# Patient Record
Sex: Female | Born: 1995 | Race: Black or African American | Hispanic: No | Marital: Single | Smoking: Never smoker
Health system: Southern US, Community
[De-identification: ages and names within clinical notes are randomized; demographics above are authoritative.]

## PROBLEM LIST (undated history)

## (undated) DIAGNOSIS — I10 Essential (primary) hypertension: Secondary | ICD-10-CM

## (undated) HISTORY — DX: Essential (primary) hypertension: I10

---
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
## 2016-01-06 ENCOUNTER — Encounter (HOSPITAL_COMMUNITY): Payer: Self-pay | Admitting: *Deleted

## 2016-01-06 ENCOUNTER — Emergency Department (HOSPITAL_COMMUNITY): Payer: Medicaid Other

## 2016-01-06 ENCOUNTER — Emergency Department (HOSPITAL_COMMUNITY)
Admission: EM | Admit: 2016-01-06 | Discharge: 2016-01-07 | Disposition: A | Discharge status: Home / Self Care | Payer: Medicaid Other | Attending: Emergency Medicine | Admitting: Emergency Medicine

## 2016-01-06 DIAGNOSIS — R000 Tachycardia, unspecified: Secondary | ICD-10-CM | POA: Insufficient documentation

## 2016-01-06 DIAGNOSIS — J159 Unspecified bacterial pneumonia: Secondary | ICD-10-CM | POA: Insufficient documentation

## 2016-01-06 DIAGNOSIS — R1084 Generalized abdominal pain: Secondary | ICD-10-CM | POA: Insufficient documentation

## 2016-01-06 DIAGNOSIS — J189 Pneumonia, unspecified organism: Secondary | ICD-10-CM

## 2016-01-06 DIAGNOSIS — Z3202 Encounter for pregnancy test, result negative: Secondary | ICD-10-CM | POA: Insufficient documentation

## 2016-01-06 LAB — COMPREHENSIVE METABOLIC PANEL
ALBUMIN: 4 g/dL (ref 3.5–5.0)
ALT: 15 U/L (ref 14–54)
ANION GAP: 10 (ref 5–15)
AST: 18 U/L (ref 15–41)
Alkaline Phosphatase: 61 U/L (ref 38–126)
BILIRUBIN TOTAL: 0.9 mg/dL (ref 0.3–1.2)
BUN: 10 mg/dL (ref 6–20)
CO2: 24 mmol/L (ref 22–32)
Calcium: 9.1 mg/dL (ref 8.9–10.3)
Chloride: 104 mmol/L (ref 101–111)
Creatinine, Ser: 0.88 mg/dL (ref 0.44–1.00)
GFR calc Af Amer: >60 mL/min (ref >60)
GFR calc non Af Amer: >60 mL/min (ref >60)
GLUCOSE: 94 mg/dL (ref 65–99)
POTASSIUM: 3 mmol/L — AB (ref 3.5–5.1)
SODIUM: 138 mmol/L (ref 135–145)
TOTAL PROTEIN: 7.7 g/dL (ref 6.5–8.1)

## 2016-01-06 LAB — CBC WITH DIFFERENTIAL/PLATELET
BASOS ABS: 0 10*3/uL (ref 0.0–0.1)
BASOS PCT: 0 %
EOS PCT: 0 %
Eosinophils Absolute: 0 10*3/uL (ref 0.0–0.7)
HCT: 29.8 % — ABNORMAL LOW (ref 36.0–46.0)
Hemoglobin: 9.2 g/dL — ABNORMAL LOW (ref 12.0–15.0)
Lymphocytes Relative: 14 %
Lymphs Abs: 1.3 10*3/uL (ref 0.7–4.0)
MCH: 19.4 pg — ABNORMAL LOW (ref 26.0–34.0)
MCHC: 30.9 g/dL (ref 30.0–36.0)
MCV: 62.9 fL — ABNORMAL LOW (ref 78.0–100.0)
MONO ABS: 1 10*3/uL (ref 0.1–1.0)
Monocytes Relative: 10 %
Neutro Abs: 7.1 10*3/uL (ref 1.7–7.7)
Neutrophils Relative %: 76 %
Platelets: 295 10*3/uL (ref 150–400)
RBC: 4.74 MIL/uL (ref 3.87–5.11)
RDW: 16.4 % — AB (ref 11.5–15.5)
WBC: 9.4 10*3/uL (ref 4.0–10.5)

## 2016-01-06 LAB — URINALYSIS, ROUTINE W REFLEX MICROSCOPIC
Bilirubin Urine: NEGATIVE
Glucose, UA: NEGATIVE mg/dL
KETONES UR: NEGATIVE mg/dL
NITRITE: NEGATIVE
PH: 7 (ref 5.0–8.0)
PROTEIN: NEGATIVE mg/dL
Specific Gravity, Urine: 1.005 (ref 1.005–1.030)

## 2016-01-06 LAB — RAPID STREP SCREEN (MED CTR MEBANE ONLY): STREPTOCOCCUS, GROUP A SCREEN (DIRECT): NEGATIVE

## 2016-01-06 LAB — PREGNANCY, URINE: PREG TEST UR: NEGATIVE

## 2016-01-06 LAB — URINE MICROSCOPIC-ADD ON: Squamous Epithelial / LPF: NONE SEEN

## 2016-01-06 LAB — LIPASE, BLOOD: Lipase: 20 U/L (ref 11–51)

## 2016-01-06 LAB — I-STAT CG4 LACTIC ACID, ED: LACTIC ACID, VENOUS: 1.94 mmol/L (ref 0.5–2.0)

## 2016-01-06 MED ORDER — DIPHENHYDRAMINE HCL 50 MG/ML IJ SOLN
25.0000 mg | Freq: Once | INTRAMUSCULAR | Status: AC
  Administered 2016-01-06: 25 mg via INTRAVENOUS
  Filled 2016-01-06: qty 1

## 2016-01-06 MED ORDER — SODIUM CHLORIDE 0.9 % IV BOLUS (SEPSIS)
1000.0000 mL | Freq: Once | INTRAVENOUS | Status: AC
  Administered 2016-01-06: 1000 mL via INTRAVENOUS

## 2016-01-06 MED ORDER — METOCLOPRAMIDE HCL 5 MG/ML IJ SOLN
10.0000 mg | Freq: Once | INTRAMUSCULAR | Status: AC
  Administered 2016-01-06: 10 mg via INTRAVENOUS
  Filled 2016-01-06: qty 2

## 2016-01-06 MED ORDER — POTASSIUM CHLORIDE CRYS ER 20 MEQ PO TBCR
40.0000 meq | EXTENDED_RELEASE_TABLET | Freq: Once | ORAL | Status: AC
  Administered 2016-01-06: 40 meq via ORAL
  Filled 2016-01-06: qty 2

## 2016-01-06 MED ORDER — SODIUM CHLORIDE 0.9 % IV BOLUS (SEPSIS)
1000.0000 mL | INTRAVENOUS | Status: AC
Start: 2016-01-06 — End: 2016-01-06
  Administered 2016-01-06: 1000 mL via INTRAVENOUS

## 2016-01-06 MED ORDER — KETOROLAC TROMETHAMINE 30 MG/ML IJ SOLN
30.0000 mg | Freq: Once | INTRAMUSCULAR | Status: AC
  Administered 2016-01-06: 30 mg via INTRAVENOUS
  Filled 2016-01-06: qty 1

## 2016-01-06 NOTE — ED Notes (Signed)
Pt has been told a urine sample is needed from her, states that she "can't try to use the bathroom right now". Will try again after she gets more fluids

## 2016-01-06 NOTE — ED Provider Notes (Signed)
CSN: [id number]     Arrival date & time 01/06/16  2129 History   First MD Initiated Contact with Patient 01/06/16 2143     Chief Complaint  Patient presents with  . Headache     (Consider location/radiation/quality/duration/timing/severity/associated sxs/prior Treatment) HPI Comments: Patient presents to the emergency department with chief complaint of headache. She states that she has had an ache for the past week, has gradually been worsening. She reports associated generalized body aches, fevers, chills. She also reports having a cough, and some generalized abdominal pain, but denies any nausea, vomiting, or diarrhea. There are no modifying factors. She denies any numbness, weakness, or tingling. Denies any dysuria or vaginal discharge. Denies any chest pain or shortness of breath. She has tried taking Tylenol for her symptoms.  The history is provided by the patient. No language interpreter was used.    History reviewed. No pertinent past medical history. History reviewed. No pertinent past surgical history. No family history on file. Social History  Substance Use Topics  . Smoking status: Never Smoker   . Smokeless tobacco: None  . Alcohol Use: Yes   OB History    No data available     Review of Systems  Constitutional: Positive for fever and chills.  HENT: Positive for postnasal drip, rhinorrhea, sinus pressure, sneezing and sore throat.   Respiratory: Positive for cough. Negative for shortness of breath.   Cardiovascular: Negative for chest pain.  Gastrointestinal: Negative for nausea, vomiting, abdominal pain, diarrhea and constipation.  Genitourinary: Negative for dysuria.  Neurological: Positive for headaches.  All other systems reviewed and are negative.     Allergies  Review of patient's allergies indicates no known allergies.  Home Medications   Prior to Admission medications   Not on File   BP 171/96 mmHg  Pulse 132  Temp(Src) 102.1 F (38.9 C) (Oral)   Resp 16  Ht 5\' 6"  (1.676 m)  SpO2 98%  LMP 01/03/2016 Physical Exam  Constitutional: She is oriented to person, place, and time. She appears well-developed and well-nourished.  HENT:  Head: Normocephalic and atraumatic.  Right Ear: External ear normal.  Left Ear: External ear normal.  Oropharynx mildly erythematous, no tonsillar exudates, no abscess, uvula is midline, airway intact  Eyes: Conjunctivae and EOM are normal. Pupils are equal, round, and reactive to light.  Neck: Normal range of motion. Neck supple.  No pain with neck flexion, no meningismus  Cardiovascular: Regular rhythm and normal heart sounds.  Exam reveals no gallop and no friction rub.   No murmur heard. Tachycardic  Pulmonary/Chest: Effort normal and breath sounds normal. No respiratory distress. She has no wheezes. She has no rales. She exhibits no tenderness.  Abdominal: Soft. She exhibits no distension and no mass. There is no tenderness. There is no rebound and no guarding.  No focal abdominal tenderness, no RLQ tenderness or pain at McBurney's point, no RUQ tenderness or Murphy's sign, no left-sided abdominal tenderness, no fluid wave, or signs of peritonitis   Musculoskeletal: Normal range of motion. She exhibits no edema or tenderness.  Normal gait.  Neurological: She is alert and oriented to person, place, and time. She has normal reflexes.  CN 3-12 intact, normal finger to nose, no pronator drift, sensation and strength intact bilaterally. Negative Kernig Negative Brudzinski  Skin: Skin is warm and dry.  Psychiatric: She has a normal mood and affect. Her behavior is normal. Judgment and thought content normal.  Nursing note and vitals reviewed.   ED Course  Procedures (including critical care time) Results for orders placed or performed during the hospital encounter of 01/06/16  Rapid strep screen  Result Value Ref Range   Streptococcus, Group A Screen (Direct) NEGATIVE NEGATIVE  CBC with  Differential/Platelet  Result Value Ref Range   WBC 9.4 4.0 - 10.5 K/uL   RBC 4.74 3.87 - 5.11 MIL/uL   Hemoglobin 9.2 (L) 12.0 - 15.0 g/dL   HCT 60.4 (L) 54.0 - 98.1 %   MCV 62.9 (L) 78.0 - 100.0 fL   MCH 19.4 (L) 26.0 - 34.0 pg   MCHC 30.9 30.0 - 36.0 g/dL   RDW 19.1 (H) 47.8 - 29.5 %   Platelets 295 150 - 400 K/uL   Neutrophils Relative % 76 %   Neutro Abs 7.1 1.7 - 7.7 K/uL   Lymphocytes Relative 14 %   Lymphs Abs 1.3 0.7 - 4.0 K/uL   Monocytes Relative 10 %   Monocytes Absolute 1.0 0.1 - 1.0 K/uL   Eosinophils Relative 0 %   Eosinophils Absolute 0.0 0.0 - 0.7 K/uL   Basophils Relative 0 %   Basophils Absolute 0.0 0.0 - 0.1 K/uL   Smear Review MORPHOLOGY UNREMARKABLE   Comprehensive metabolic panel  Result Value Ref Range   Sodium 138 135 - 145 mmol/L   Potassium 3.0 (L) 3.5 - 5.1 mmol/L   Chloride 104 101 - 111 mmol/L   CO2 24 22 - 32 mmol/L   Glucose, Bld 94 65 - 99 mg/dL   BUN 10 6 - 20 mg/dL   Creatinine, Ser 6.21 0.44 - 1.00 mg/dL   Calcium 9.1 8.9 - 30.8 mg/dL   Total Protein 7.7 6.5 - 8.1 g/dL   Albumin 4.0 3.5 - 5.0 g/dL   AST 18 15 - 41 U/L   ALT 15 14 - 54 U/L   Alkaline Phosphatase 61 38 - 126 U/L   Total Bilirubin 0.9 0.3 - 1.2 mg/dL   GFR calc non Af Amer >60 >60 mL/min   GFR calc Af Amer >60 >60 mL/min   Anion gap 10 5 - 15  Lipase, blood  Result Value Ref Range   Lipase 20 11 - 51 U/L  Urinalysis, Routine w reflex microscopic (not at [facility])  Result Value Ref Range   Color, Urine YELLOW YELLOW   APPearance CLEAR CLEAR   Specific Gravity, Urine 1.005 1.005 - 1.030   pH 7.0 5.0 - 8.0   Glucose, UA NEGATIVE NEGATIVE mg/dL   Hgb urine dipstick LARGE (A) NEGATIVE   Bilirubin Urine NEGATIVE NEGATIVE   Ketones, ur NEGATIVE NEGATIVE mg/dL   Protein, ur NEGATIVE NEGATIVE mg/dL   Nitrite NEGATIVE NEGATIVE   Leukocytes, UA TRACE (A) NEGATIVE  Pregnancy, urine  Result Value Ref Range   Preg Test, Ur NEGATIVE NEGATIVE  Urine microscopic-add on  Result  Value Ref Range   Squamous Epithelial / LPF NONE SEEN NONE SEEN   WBC, UA 0-5 0 - 5 WBC/hpf   RBC / HPF TOO NUMEROUS TO COUNT 0 - 5 RBC/hpf   Bacteria, UA FEW (A) NONE SEEN  I-Stat CG4 Lactic Acid, ED  Result Value Ref Range   Lactic Acid, Venous 1.94 0.5 - 2.0 mmol/L   Dg Chest 2 View  01/06/2016  CLINICAL DATA:  20 year old female with fever and chills EXAM: CHEST  2 VIEW COMPARISON:  None. FINDINGS: Two views of the chest demonstrate minimal atelectatic changes of the left lung base. Developing pneumonia is not excluded. Clinical correlation is recommended. There  is no focal consolidation, pleural effusion, or pneumothorax. The cardiac silhouette is within normal limits. No acute osseous pathology. IMPRESSION: Left lung base atelectasis.  No focal consolidation. Electronically Signed   By: Elgie Collard M.D.   On: 01/06/2016 22:07    I have personally reviewed and evaluated these images and lab results as part of my medical decision-making.    MDM   Final diagnoses:  CAP (community acquired pneumonia)    Patient with fever, chills, generalized body aches, and headache. She has had a headache for one week. She has no difficulty moving her neck in any direction. She is smiling and laughing with friends. She does not appear toxic. I have very low suspicion for meningitis or encephalitis. Will give fluids, give Tylenol, and will reassess. Patient is neurovascularly intact.  Chest x-ray remarkable for possible developing pneumonia.  Patient seen by and discussed with Dr. Wilkie Aye, who agrees that patient is very well appearing.  Recommends DC to home with azithromycin.  PCP follow-up.  Return precautions discussed.  Pertinent prior notes, workups, and admissions reviewed by me in the EMR. Patient understands and agrees with the plan.  Verbal and written follow-up and return precautions given. Patient is stable and ready for discharge.        Roxy Horseman, PA-C 01/07/16  3086  Bethann Berkshire, MD 01/08/16 1027

## 2016-01-06 NOTE — ED Notes (Addendum)
Pt c/o headache "constant" for 1 week. Also c/o midline abdominal pain today, no n/v/d. Last tylenol at noon

## 2016-01-07 MED ORDER — AZITHROMYCIN 250 MG PO TABS: 250.0000 mg | ORAL_TABLET | Freq: Every day | ORAL | Status: DC

## 2016-01-07 NOTE — Discharge Instructions (Signed)

## 2016-01-08 LAB — URINE CULTURE

## 2016-01-09 LAB — CULTURE, GROUP A STREP (THRC)

## 2016-01-13 DIAGNOSIS — R87612 Low grade squamous intraepithelial lesion on cytologic smear of cervix (LGSIL): Secondary | ICD-10-CM | POA: Insufficient documentation

## 2016-04-30 ENCOUNTER — Emergency Department (HOSPITAL_COMMUNITY)
Admission: EM | Admit: 2016-04-30 | Discharge: 2016-04-30 | Disposition: A | Discharge status: Home / Self Care | Payer: Medicaid Other | Attending: Emergency Medicine | Admitting: Emergency Medicine

## 2016-04-30 ENCOUNTER — Encounter (HOSPITAL_COMMUNITY): Payer: Self-pay

## 2016-04-30 DIAGNOSIS — N938 Other specified abnormal uterine and vaginal bleeding: Secondary | ICD-10-CM | POA: Insufficient documentation

## 2016-04-30 DIAGNOSIS — Z79899 Other long term (current) drug therapy: Secondary | ICD-10-CM | POA: Insufficient documentation

## 2016-04-30 LAB — URINALYSIS, ROUTINE W REFLEX MICROSCOPIC
BILIRUBIN URINE: NEGATIVE
Glucose, UA: NEGATIVE mg/dL
KETONES UR: NEGATIVE mg/dL
NITRITE: NEGATIVE
Protein, ur: NEGATIVE mg/dL
SPECIFIC GRAVITY, URINE: 1.014 (ref 1.005–1.030)
pH: 8 (ref 5.0–8.0)

## 2016-04-30 LAB — WET PREP, GENITAL
Sperm: NONE SEEN
Trich, Wet Prep: NONE SEEN
Yeast Wet Prep HPF POC: NONE SEEN

## 2016-04-30 LAB — URINE MICROSCOPIC-ADD ON

## 2016-04-30 LAB — POC URINE PREG, ED: Preg Test, Ur: NEGATIVE

## 2016-04-30 NOTE — Discharge Instructions (Signed)
Please follow up with GYN for further evaluation. Return if heavy bleeding, fever, severe abdominal pain, any new concerning symptom. Your cultures still pending, you will be called if return abnormal.

## 2016-04-30 NOTE — ED Notes (Signed)
Patient ambulatory to restroom for urine sample.

## 2016-04-30 NOTE — ED Notes (Signed)
Pelvic setup at bedside.

## 2016-04-30 NOTE — ED Provider Notes (Signed)
[facility]-EMERGENCY DEPT Provider Note   CSN: [id number] Arrival date & time: 04/30/16  1843     History   Chief Complaint Chief Complaint  Patient presents with  . Vaginal Bleeding    HPI Lindsay Hess is a 20 y.o. female.  HPI Lindsay Hess is a 20 y.o. female with no medical problems, presents to emergency department complaining of irregular vaginal bleeding. Patient states that for the last 2 months she has had some bleeding that would start just a day or 2 after her regular period would and. She denies any associated abdominal pain or cramping. States bleeding is moderately heavy. She denies any urinary symptoms. She denies any nausea or vomiting. She has not tried any medications for this. She is sexually active with one partner, does not use protection. Unsure if she could be pregnant.  History reviewed. No pertinent past medical history.  There are no active problems to display for this patient.   No past surgical history on file.  OB History    No data available       Home Medications    Prior to Admission medications   Medication Sig Start Date End Date Taking? Authorizing Provider  azithromycin (ZITHROMAX) 250 MG tablet Take 1 tablet (250 mg total) by mouth daily. Take first 2 tablets together, then 1 every day until finished. 01/07/16   Roxy Horseman, PA-C    Family History No family history on file.  Social History Social History  Substance Use Topics  . Smoking status: Never Smoker  . Smokeless tobacco: Not on file  . Alcohol use Yes     Allergies   Review of patient's allergies indicates no known allergies.   Review of Systems Review of Systems  Constitutional: Negative for chills and fever.  Respiratory: Negative for cough, chest tightness and shortness of breath.   Cardiovascular: Negative for chest pain, palpitations and leg swelling.  Gastrointestinal: Negative for abdominal pain, diarrhea, nausea and vomiting.  Genitourinary: Positive  for vaginal bleeding. Negative for dysuria, flank pain, pelvic pain, vaginal discharge and vaginal pain.  Musculoskeletal: Negative for arthralgias, myalgias, neck pain and neck stiffness.  Skin: Negative for rash.  Neurological: Negative for dizziness, weakness and headaches.  All other systems reviewed and are negative.    Physical Exam Updated Vital Signs BP (!) 168/101 (BP Location: Left Arm)   Pulse 86   Temp 99.1 F (37.3 C) (Oral)   Resp 16   LMP 04/17/2016 (Approximate)   SpO2 100%   Physical Exam  Constitutional: She appears well-developed and well-nourished. No distress.  HENT:  Head: Normocephalic.  Eyes: Conjunctivae are normal.  Neck: Neck supple.  Cardiovascular: Normal rate, regular rhythm and normal heart sounds.   Pulmonary/Chest: Effort normal and breath sounds normal. No respiratory distress. She has no wheezes. She has no rales.  Abdominal: Soft. Bowel sounds are normal. She exhibits no distension. There is no tenderness. There is no rebound.  Genitourinary:  Genitourinary Comments: Normal external genitalia. Normal vaginal canal. Small thin pink discharge. Cervix is normal, closed. No CMT. No uterine or adnexal tenderness. No masses palpated.    Musculoskeletal: She exhibits no edema.  Neurological: She is alert.  Skin: Skin is warm and dry.  Psychiatric: She has a normal mood and affect. Her behavior is normal.  Nursing note and vitals reviewed.    ED Treatments / Results  Labs (all labs ordered are listed, but only abnormal results are displayed) Labs Reviewed  WET PREP, GENITAL - Abnormal; Notable  for the following:       Result Value   Clue Cells Wet Prep HPF POC PRESENT (*)    WBC, Wet Prep HPF POC MODERATE (*)    All other components within normal limits  URINALYSIS, ROUTINE W REFLEX MICROSCOPIC (NOT AT [facility]) - Abnormal; Notable for the following:    APPearance CLOUDY (*)    Hgb urine dipstick LARGE (*)    Leukocytes, UA SMALL (*)    All  other components within normal limits  URINE MICROSCOPIC-ADD ON - Abnormal; Notable for the following:    Squamous Epithelial / LPF 0-5 (*)    Bacteria, UA FEW (*)    All other components within normal limits  POC URINE PREG, ED  GC/CHLAMYDIA PROBE AMP ([place]) NOT AT [facility]    EKG  EKG Interpretation None       Radiology No results found.  Procedures Procedures (including critical care time)  Medications Ordered in ED Medications - No data to display   Initial Impression / Assessment and Plan / ED Course  I have reviewed the triage vital signs and the nursing notes.  Pertinent labs & imaging results that were available during my care of the patient were reviewed by me and considered in my medical decision making (see chart for details).  Clinical Course    Patient emergency department with irregular vaginal bleeding. No pain. Not on any birth control at this time. Does not appear to be in any distress. No dizziness or lightheadedness. Will check a pregnancy test, urinalysis, pelvic exam.  Pelvic exam unremarkable. Just small pink discharge. Will dc home with ob/gyn follow up . Gc/chlamydia cultuers pending.    Final Clinical Impressions(s) / ED Diagnoses   Final diagnoses:  Dysfunctional uterine bleeding    New Prescriptions Discharge Medication List as of 04/30/2016  9:18 PM       Jaynie Crumbleatyana [facility], PA-C 05/01/16 2022    Pricilla LovelessScott Goldston, MD 05/10/16 [phone]

## 2016-04-30 NOTE — ED Notes (Signed)
Patient d/c'd self care.  F/U reviewed.  Patient verbalized understanding. 

## 2016-04-30 NOTE — ED Triage Notes (Signed)
She states that for two months in a row she has had bleeding "after my normal period ended".  She denies pain/fever/n/v/d/dysuria and is in no distress.  She describes the amount of bleeding as "moderate" with no clots.

## 2016-05-03 LAB — GC/CHLAMYDIA PROBE AMP (~~LOC~~) NOT AT ARMC
CHLAMYDIA, DNA PROBE: NEGATIVE
Neisseria Gonorrhea: NEGATIVE

## 2016-05-08 ENCOUNTER — Emergency Department (HOSPITAL_COMMUNITY)
Admission: EM | Admit: 2016-05-08 | Discharge: 2016-05-08 | Disposition: A | Discharge status: Home / Self Care | Payer: Medicaid Other | Attending: Emergency Medicine | Admitting: Emergency Medicine

## 2016-05-08 ENCOUNTER — Encounter (HOSPITAL_COMMUNITY): Payer: Self-pay | Admitting: Oncology

## 2016-05-08 DIAGNOSIS — B9689 Other specified bacterial agents as the cause of diseases classified elsewhere: Secondary | ICD-10-CM

## 2016-05-08 DIAGNOSIS — N760 Acute vaginitis: Secondary | ICD-10-CM | POA: Diagnosis not present

## 2016-05-08 DIAGNOSIS — A64 Unspecified sexually transmitted disease: Secondary | ICD-10-CM | POA: Diagnosis present

## 2016-05-08 LAB — WET PREP, GENITAL
Sperm: NONE SEEN
TRICH WET PREP: NONE SEEN
YEAST WET PREP: NONE SEEN

## 2016-05-08 MED ORDER — METRONIDAZOLE 0.75 % VA GEL: 1.0000 | Freq: Two times a day (BID) | VAGINAL | 0 refills | Status: DC

## 2016-05-08 NOTE — ED Provider Notes (Signed)
[facility]-EMERGENCY DEPT Provider Note   CSN: [id number] Arrival date & time: 05/08/16  0404     History   Chief Complaint Chief Complaint  Patient presents with  . SEXUALLY TRANSMITTED DISEASE    HPI Lindsay Hess is a 20 y.o. female.  20 year old female presents to the emergency department for evaluation of a malodorous discharge times one month. She states that she came at 4:30 AM today because she "has been busy". Patient was seen one week ago for vaginal complaints. She is concerned about STDs because she engaged in unprotected sex with one partner 4 weeks ago. She reports last being sexually active 2 weeks ago. Patient denies associated abdominal pain, nausea, vomiting, fever, or urinary symptoms. She states that the vaginal bleeding from her prior visit has resolved.     History reviewed. No pertinent past medical history.  There are no active problems to display for this patient.   History reviewed. No pertinent surgical history.  OB History    No data available       Home Medications    Prior to Admission medications   Medication Sig Start Date End Date Taking? Authorizing Provider  azithromycin (ZITHROMAX) 250 MG tablet Take 1 tablet (250 mg total) by mouth daily. Take first 2 tablets together, then 1 every day until finished. 01/07/16   Roxy Horsemanobert Browning, PA-C  metroNIDAZOLE (METROGEL VAGINAL) 0.75 % vaginal gel Place 1 Applicatorful vaginally 2 (two) times daily. Use for 1 week 05/08/16   Antony MaduraKelly Thorne Wirz, PA-C    Family History No family history on file.  Social History Social History  Substance Use Topics  . Smoking status: Never Smoker  . Smokeless tobacco: Never Used  . Alcohol use Yes     Allergies   Review of patient's allergies indicates no known allergies.   Review of Systems Review of Systems  Constitutional: Negative for fever.  Gastrointestinal: Negative for abdominal pain, nausea and vomiting.  Genitourinary: Positive for vaginal discharge.    Ten systems reviewed and are negative for acute change, except as noted in the HPI.     Physical Exam Updated Vital Signs BP (!) 150/105 (BP Location: Left Arm)   Pulse 91   Temp 98.3 F (36.8 C) (Oral)   Resp 14   Ht 5\' 6"  (1.676 m)   Wt 95.7 kg   LMP 04/17/2016 (Approximate)   SpO2 100%   BMI 34.06 kg/m   Physical Exam  Constitutional: She is oriented to person, place, and time. She appears well-developed and well-nourished. No distress.  Nontoxic appearing  HENT:  Head: Normocephalic and atraumatic.  Eyes: Conjunctivae and EOM are normal. No scleral icterus.  Neck: Normal range of motion.  Pulmonary/Chest: Effort normal. No respiratory distress.  Respirations even and unlabored  Abdominal: Soft. She exhibits no distension and no mass. There is no tenderness. There is no guarding.  Soft, nontender abdomen.  Genitourinary: There is no rash, tenderness or lesion on the right labia. There is no rash, tenderness or lesion on the left labia. Injury: thin, white. Vaginal discharge found.  Musculoskeletal: Normal range of motion.  Neurological: She is alert and oriented to person, place, and time.  Skin: Skin is warm and dry. No rash noted. She is not diaphoretic. No erythema. No pallor.  Psychiatric: She has a normal mood and affect. Her behavior is normal.  Nursing note and vitals reviewed.    ED Treatments / Results  Labs (all labs ordered are listed, but only abnormal results are displayed) Labs  Reviewed  WET PREP, GENITAL - Abnormal; Notable for the following:       Result Value   Clue Cells Wet Prep HPF POC PRESENT (*)    WBC, Wet Prep HPF POC MANY (*)    All other components within normal limits    EKG  EKG Interpretation None       Radiology No results found.  Procedures Procedures (including critical care time)  Medications Ordered in ED Medications - No data to display   Initial Impression / Assessment and Plan / ED Course  I have reviewed the  triage vital signs and the nursing notes.  Pertinent labs & imaging results that were available during my care of the patient were reviewed by me and considered in my medical decision making (see chart for details).  Clinical Course    20 year old female presents to the emergency department for evaluation of vaginal discharge x 1 month. She expresses concern about STDs; however, she was last sexually active 2 weeks ago and had negative gonorrhea and chlamydia tests at her evaluation in this emergency department one week ago. Doubt STDs. Patient has no complaints of abdominal pain and abdomen is soft, nontender. She is afebrile. Wet prep shows clue cells consistent with bacterial vaginosis. Will treat with MetroGel and have the patient follow-up with an OB/GYN. Return precautions given at discharge. Patient discharged in stable condition.   Final Clinical Impressions(s) / ED Diagnoses   Final diagnoses:  BV (bacterial vaginosis)    New Prescriptions New Prescriptions   METRONIDAZOLE (METROGEL VAGINAL) 0.75 % VAGINAL GEL    Place 1 Applicatorful vaginally 2 (two) times daily. Use for 1 week     Antony Madura, PA-C 05/08/16 0458    Gilda Crease, MD 05/08/16 [phone]

## 2016-05-08 NOTE — ED Triage Notes (Signed)
Pt presents d/t a foul odor from her vagina.  Has had unprotected sex recently.  Pt is concerned she may have an STD.  Denies urinary sx.

## 2017-08-18 IMAGING — CR DG CHEST 2V
2 series · 2 of 2 positions shown · non-contrast
Comparison: None.

CLINICAL DATA: 20-year-old female with fever and chills

EXAM:
CHEST  2 VIEW

[w chest pa]
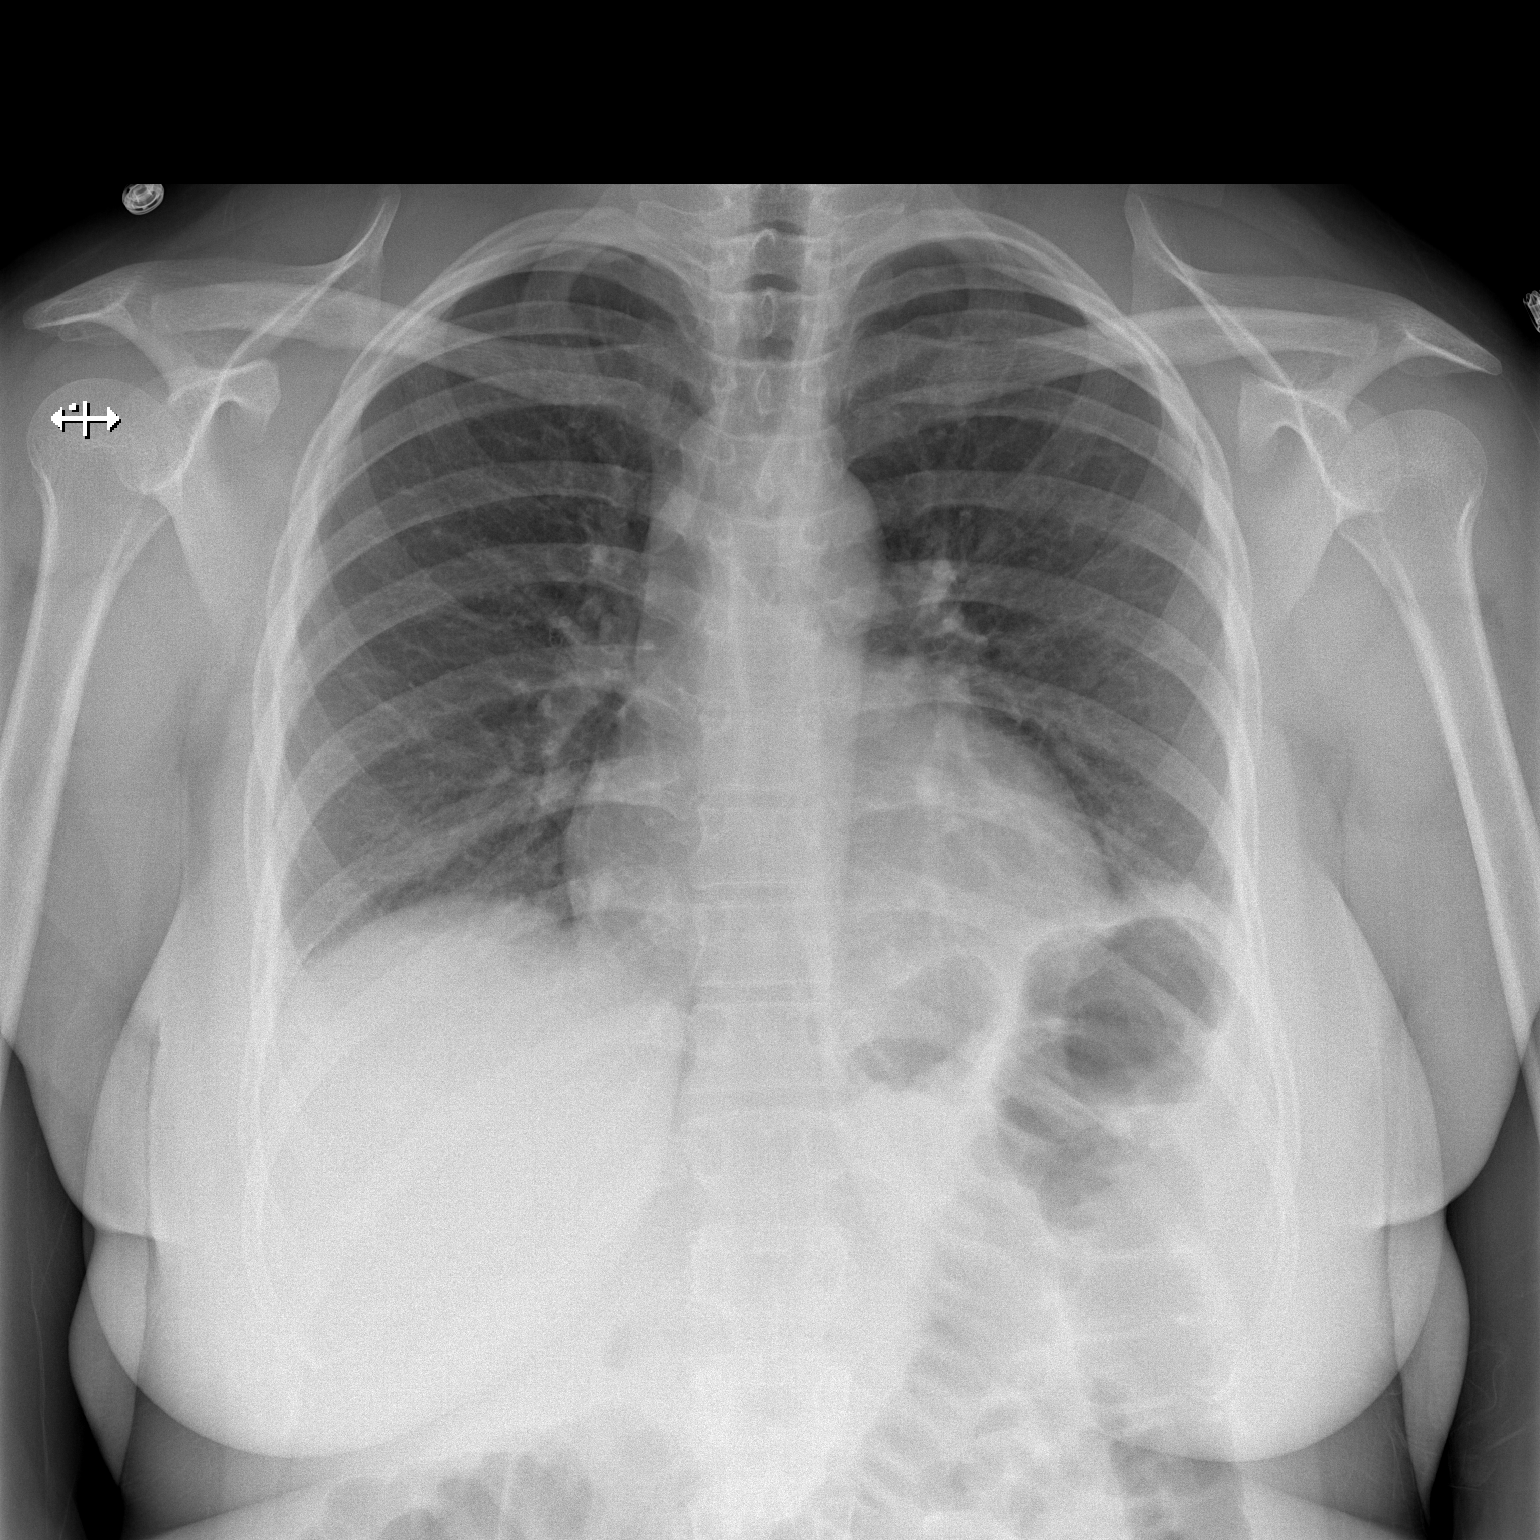

[w chest lat]
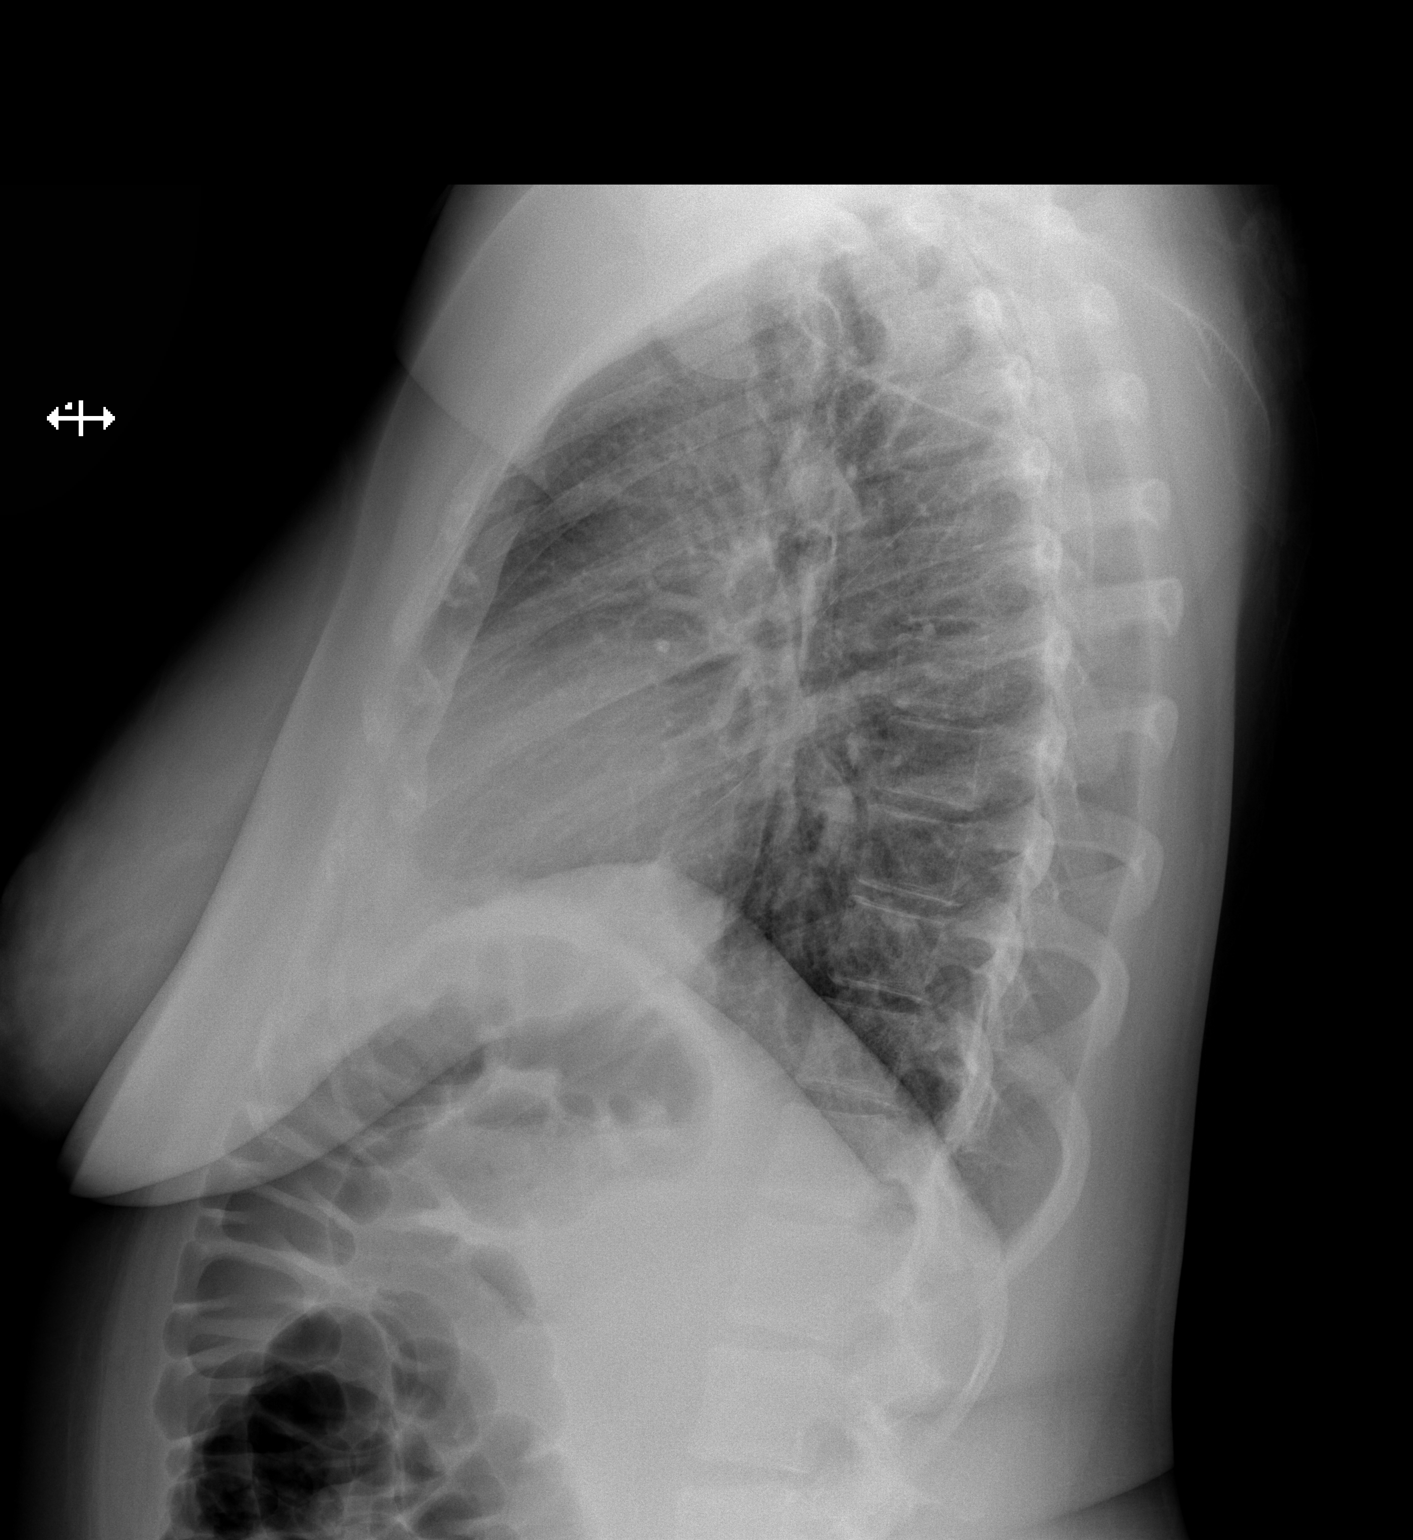

[2 of 2 positions shown; findings below may reference images not displayed]

FINDINGS: Two views of the chest demonstrate minimal atelectatic changes of
the left lung base. Developing pneumonia is not excluded. Clinical
correlation is recommended. There is no focal consolidation, pleural
effusion, or pneumothorax. The cardiac silhouette is within normal
limits. No acute osseous pathology.
IMPRESSION: Left lung base atelectasis.  No focal consolidation.

## 2020-02-23 ENCOUNTER — Emergency Department (HOSPITAL_BASED_OUTPATIENT_CLINIC_OR_DEPARTMENT_OTHER)
Admission: EM | Admit: 2020-02-23 | Discharge: 2020-02-23 | Disposition: A | Discharge status: Home / Self Care | Payer: Worker's Compensation | Attending: Emergency Medicine | Admitting: Emergency Medicine

## 2020-02-23 DIAGNOSIS — Y9269 Other specified industrial and construction area as the place of occurrence of the external cause: Secondary | ICD-10-CM | POA: Diagnosis not present

## 2020-02-23 DIAGNOSIS — Y990 Civilian activity done for income or pay: Secondary | ICD-10-CM | POA: Diagnosis not present

## 2020-02-23 DIAGNOSIS — S61012A Laceration without foreign body of left thumb without damage to nail, initial encounter: Secondary | ICD-10-CM | POA: Diagnosis not present

## 2020-02-23 DIAGNOSIS — W260XXA Contact with knife, initial encounter: Secondary | ICD-10-CM | POA: Diagnosis not present

## 2020-02-23 DIAGNOSIS — Z23 Encounter for immunization: Secondary | ICD-10-CM | POA: Diagnosis not present

## 2020-02-23 DIAGNOSIS — Y9389 Activity, other specified: Secondary | ICD-10-CM | POA: Diagnosis not present

## 2020-02-23 MED ORDER — TETANUS-DIPHTH-ACELL PERTUSSIS 5-2.5-18.5 LF-MCG/0.5 IM SUSP
0.5000 mL | Freq: Once | INTRAMUSCULAR | Status: AC
  Administered 2020-02-23: 0.5 mL via INTRAMUSCULAR
  Filled 2020-02-23: qty 0.5

## 2020-02-23 NOTE — ED Triage Notes (Signed)
Laceration to L thumb while she was cutting something at work.

## 2020-02-23 NOTE — ED Provider Notes (Signed)
Lindsay Hess Provider Note   CSN: [id number] Arrival date & time: 02/23/20  3762     History Chief Complaint  Patient presents with  . Laceration    Lindsay Hess is a 24 y.o. female.  The history is provided by the patient.  Laceration Location:  Hand Hand laceration location:  L fingers Depth:  Cutaneous Quality: straight   Bleeding: controlled   Laceration mechanism:  Knife Pain details:    Severity:  No pain Foreign body present:  No foreign bodies Tetanus status:  Unknown Associated symptoms: no fever, no focal weakness, no numbness, no rash, no redness, no swelling and no streaking        No past medical history on file.  There are no problems to display for this patient.   No past surgical history on file.   OB History   No obstetric history on file.     No family history on file.  Social History   Tobacco Use  . Smoking status: Never Smoker  . Smokeless tobacco: Never Used  Substance Use Topics  . Alcohol use: Yes  . Drug use: No    Home Medications Prior to Admission medications   Medication Sig Start Date End Date Taking? Authorizing Provider  azithromycin (ZITHROMAX) 250 MG tablet Take 1 tablet (250 mg total) by mouth daily. Take first 2 tablets together, then 1 every day until finished. 01/07/16   Montine Circle, PA-C  metroNIDAZOLE (METROGEL VAGINAL) 0.75 % vaginal gel Place 1 Applicatorful vaginally 2 (two) times daily. Use for 1 week 05/08/16   Antonietta Breach, PA-C    Allergies    Patient has no known allergies.  Review of Systems   Review of Systems  Constitutional: Negative for fever.  Skin: Positive for wound. Negative for color change, pallor and rash.  Neurological: Negative for focal weakness, weakness and numbness.    Physical Exam Updated Vital Signs BP (!) 160/120 (BP Location: Right Arm)   Pulse (!) 107   Temp 99 F (37.2 C)   Resp 18   Ht 5\' 6"  (1.676 m)   Wt 111.1 kg   LMP  02/20/2020   SpO2 100%   BMI 39.54 kg/m   Physical Exam Constitutional:      General: She is not in acute distress.    Appearance: She is not ill-appearing.  HENT:     Head: Normocephalic.  Musculoskeletal:        General: No deformity. Normal range of motion.  Skin:    Comments: 3-4 cm hemostatic laceration to thumb  Neurological:     General: No focal deficit present.     Mental Status: She is alert.     Sensory: No sensory deficit.     Motor: No weakness.     ED Results / Procedures / Treatments   Labs (all labs ordered are listed, but only abnormal results are displayed) Labs Reviewed - No data to display  EKG None  Radiology No results found.  Procedures .Lindsay Hess  Date/Time: 02/23/2020 9:12 AM Performed by: Lennice Sites, DO Authorized by: Lennice Sites, DO   Consent:    Consent obtained:  Verbal   Consent given by:  Patient   Risks discussed:  Infection, need for additional Hess, nerve damage, pain, poor cosmetic result, poor wound healing, retained foreign body, tendon damage and vascular damage   Alternatives discussed:  No treatment Anesthesia (see MAR for exact dosages):    Anesthesia method:  None Laceration details:  Location:  Hand   Hand location: left thumb.   Length (cm):  3   Depth (mm):  1 Hess type:    Hess type:  Simple Exploration:    Wound exploration: wound explored through full range of motion and entire depth of wound probed and visualized     Wound extent: no areolar tissue violation noted, no fascia violation noted, no foreign bodies/material noted, no muscle damage noted, no nerve damage noted, no tendon damage noted, no underlying fracture noted and no vascular damage noted     Contaminated: no   Treatment:    Irrigation solution:  Sterile water   Irrigation volume:  500 cc   Irrigation method:  Pressure wash   Visualized foreign bodies/material removed: no   Skin Hess:    Hess method:  Tissue  adhesive and Steri-Strips   Number of Steri-Strips:  4 Approximation:    Approximation:  Close Post-procedure details:    Dressing:  Open (no dressing)   Patient tolerance of procedure:  Tolerated well, no immediate complications   (including critical care time)  Medications Ordered in ED Medications  Tdap (BOOSTRIX) injection 0.5 mL (has no administration in time range)    ED Course  I have reviewed the triage vital signs and the nursing notes.  Pertinent labs & imaging results that were available during my care of the patient were reviewed by me and considered in my medical decision making (see chart for details).    MDM Rules/Calculators/A&P                          Lindsay Hess is a 24 year old female with no significant medical history presents the ED with left thumb laceration.  Patient cut thumb on a knife.  Superficial wound.  Does not want stitches which is reasonable as patient will likely do well with Dermabond.  There is no involvement of any tendons or ligaments.  She has good range of motion of the left thumb.  Wound is hemostatic.  Wound is washed out and Dermabond.  Tetanus shot was updated.  Given return precautions and discharged from the ED in good condition.  This chart was dictated using voice recognition software.  Despite best efforts to proofread,  errors can occur which can change the documentation meaning.   Final Clinical Impression(s) / ED Diagnoses Final diagnoses:  Laceration of left thumb without foreign body without damage to nail, initial encounter    Rx / DC Orders ED Discharge Orders    None       Virgina Norfolk, DO 02/23/20 [phone]

## 2021-03-25 ENCOUNTER — Encounter (HOSPITAL_BASED_OUTPATIENT_CLINIC_OR_DEPARTMENT_OTHER): Payer: Self-pay | Admitting: Emergency Medicine

## 2021-03-25 ENCOUNTER — Emergency Department (HOSPITAL_BASED_OUTPATIENT_CLINIC_OR_DEPARTMENT_OTHER)
Admission: EM | Admit: 2021-03-25 | Discharge: 2021-03-25 | Disposition: A | Discharge status: Home / Self Care | Payer: Self-pay | Attending: Emergency Medicine | Admitting: Emergency Medicine

## 2021-03-25 ENCOUNTER — Other Ambulatory Visit: Payer: Self-pay

## 2021-03-25 DIAGNOSIS — I10 Essential (primary) hypertension: Secondary | ICD-10-CM | POA: Insufficient documentation

## 2021-03-25 MED ORDER — AMLODIPINE BESYLATE 2.5 MG PO TABS: 2.5000 mg | ORAL_TABLET | Freq: Every day | ORAL | 0 refills | Status: DC

## 2021-03-25 MED ORDER — AMLODIPINE BESYLATE 5 MG PO TABS
2.5000 mg | ORAL_TABLET | Freq: Once | ORAL | Status: AC
  Administered 2021-03-25: 2.5 mg via ORAL
  Filled 2021-03-25: qty 1

## 2021-03-25 NOTE — Discharge Instructions (Addendum)
Please follow-up with the family doctor in the office.  If much more training then the emergency provider does on long-term management of your blood pressure.

## 2021-03-25 NOTE — ED Provider Notes (Signed)
[facility] EMERGENCY DEPARTMENT Provider Note   CSN: [id number] Arrival date & time: 03/25/21  0533     History Chief Complaint  Patient presents with   Hypertension    Lindsay Hess is a 25 y.o. female.  25 yo F with a chief complaints of hypertension.  The patient had been seen recently and was told that she had high blood pressure since it has been checking her pressure at home and noticed that its been high.  She does not have a family physician and was concerned about her blood pressure and so came in to be evaluated.  Patient has frontal headaches frequently which she thinks is likely due to her blood pressure.  She tells me that they are very mild in character and she is not really having 1 now.  Localized to the frontal area denies trauma denies numbness or weakness denies difficulty speech or swallowing.  Denies fevers or neck pain.  The history is provided by the patient.  Hypertension This is a new problem. The current episode started 2 days ago. The problem occurs constantly. The problem has not changed since onset.Associated symptoms include headaches. Pertinent negatives include no chest pain and no shortness of breath. Nothing aggravates the symptoms. Nothing relieves the symptoms. She has tried nothing for the symptoms. The treatment provided no relief.      History reviewed. No pertinent past medical history.  There are no problems to display for this patient.   History reviewed. No pertinent surgical history.   OB History   No obstetric history on file.     Family History  Problem Relation Age of Onset   Hypertension Mother    Diabetes Other    Hypertension Other     Social History   Tobacco Use   Smoking status: Never   Smokeless tobacco: Never  Vaping Use   Vaping Use: Never used  Substance Use Topics   Alcohol use: Yes    Comment: occ   Drug use: No    Home Medications Prior to Admission medications   Medication Sig Start  Date End Date Taking? Authorizing Provider  amLODipine (NORVASC) 2.5 MG tablet Take 1 tablet (2.5 mg total) by mouth daily. 03/25/21 04/24/21 Yes Melene Plan, DO  azithromycin (ZITHROMAX) 250 MG tablet Take 1 tablet (250 mg total) by mouth daily. Take first 2 tablets together, then 1 every day until finished. 01/07/16   Roxy Horseman, PA-C  metroNIDAZOLE (METROGEL VAGINAL) 0.75 % vaginal gel Place 1 Applicatorful vaginally 2 (two) times daily. Use for 1 week 05/08/16   Antony Madura, PA-C    Allergies    Patient has no known allergies.  Review of Systems   Review of Systems  Constitutional:  Negative for chills and fever.  HENT:  Negative for congestion and rhinorrhea.   Eyes:  Negative for redness and visual disturbance.  Respiratory:  Negative for shortness of breath and wheezing.   Cardiovascular:  Negative for chest pain and palpitations.  Gastrointestinal:  Negative for nausea and vomiting.  Genitourinary:  Negative for dysuria and urgency.  Musculoskeletal:  Negative for arthralgias and myalgias.  Skin:  Negative for pallor and wound.  Neurological:  Positive for headaches. Negative for dizziness.   Physical Exam Updated Vital Signs BP (!) 140/115   Pulse 96   Temp 98.5 F (36.9 C) (Oral)   Resp 16   Ht 5\' 6"  (1.676 m)   Wt 113.4 kg   LMP 03/20/2021   SpO2 100%  BMI 40.35 kg/m   Physical Exam Vitals and nursing note reviewed.  Constitutional:      General: She is not in acute distress.    Appearance: She is well-developed. She is not diaphoretic.  HENT:     Head: Normocephalic and atraumatic.  Eyes:     Pupils: Pupils are equal, round, and reactive to light.  Cardiovascular:     Rate and Rhythm: Normal rate and regular rhythm.     Heart sounds: No murmur heard.   No friction rub. No gallop.  Pulmonary:     Effort: Pulmonary effort is normal.     Breath sounds: No wheezing or rales.  Abdominal:     General: There is no distension.     Palpations: Abdomen is  soft.     Tenderness: There is no abdominal tenderness.  Musculoskeletal:        General: No tenderness.     Cervical back: Normal range of motion and neck supple.  Skin:    General: Skin is warm and dry.  Neurological:     Mental Status: She is alert and oriented to person, place, and time.     GCS: GCS eye subscore is 4. GCS verbal subscore is 5. GCS motor subscore is 6.     Cranial Nerves: Cranial nerves are intact.     Sensory: Sensation is intact.     Motor: Motor function is intact.     Coordination: Coordination is intact.     Comments: Benign neurologic exam  Psychiatric:        Behavior: Behavior normal.    ED Results / Procedures / Treatments   Labs (all labs ordered are listed, but only abnormal results are displayed) Labs Reviewed - No data to display  EKG None  Radiology No results found.  Procedures Procedures   Medications Ordered in ED Medications  amLODipine (NORVASC) tablet 2.5 mg (2.5 mg Oral Given 03/25/21 6144)    ED Course  I have reviewed the triage vital signs and the nursing notes.  Pertinent labs & imaging results that were available during my care of the patient were reviewed by me and considered in my medical decision making (see chart for details).    MDM Rules/Calculators/A&P                           25 yo F with a chief complaints of hypertension.  She also has been complaining of headaches but does not have one currently.  Benign neurologic exam.  I doubt she is having a hypertensive emergency.  Will start on low-dose amlodipine.  Really encouraged her to find a family physician.  6:43 AM:  I have discussed the diagnosis/risks/treatment options with the patient and believe the pt to be eligible for discharge home to follow-up with PCP. We also discussed returning to the ED immediately if new or worsening sx occur. We discussed the sx which are most concerning (e.g., sudden worsening pain, fever, inability to tolerate by mouth) that  necessitate immediate return. Medications administered to the patient during their visit and any new prescriptions provided to the patient are listed below.  Medications given during this visit Medications  amLODipine (NORVASC) tablet 2.5 mg (2.5 mg Oral Given 03/25/21 3154)     The patient appears reasonably screen and/or stabilized for discharge and I doubt any other medical condition or other [facility] requiring further screening, evaluation, or treatment in the ED at this time prior to  discharge.   Final Clinical Impression(s) / ED Diagnoses Final diagnoses:  Primary hypertension    Rx / DC Orders ED Discharge Orders          Ordered    amLODipine (NORVASC) 2.5 MG tablet  Daily        03/25/21 0610             Melene Plan, DO 03/25/21 [phone]

## 2021-03-25 NOTE — ED Triage Notes (Signed)
Pt states she has high blood pressure  Pt states her head has been hurting and her pressure was high today and yesterday

## 2021-05-24 ENCOUNTER — Encounter (HOSPITAL_BASED_OUTPATIENT_CLINIC_OR_DEPARTMENT_OTHER): Payer: Self-pay | Admitting: Urology

## 2021-05-24 ENCOUNTER — Emergency Department (HOSPITAL_BASED_OUTPATIENT_CLINIC_OR_DEPARTMENT_OTHER)
Admission: EM | Admit: 2021-05-24 | Discharge: 2021-05-24 | Disposition: A | Discharge status: Home / Self Care | Payer: No Typology Code available for payment source | Attending: Emergency Medicine | Admitting: Emergency Medicine

## 2021-05-24 ENCOUNTER — Other Ambulatory Visit: Payer: Self-pay

## 2021-05-24 DIAGNOSIS — Z760 Encounter for issue of repeat prescription: Secondary | ICD-10-CM | POA: Diagnosis present

## 2021-05-24 DIAGNOSIS — Z79899 Other long term (current) drug therapy: Secondary | ICD-10-CM | POA: Insufficient documentation

## 2021-05-24 DIAGNOSIS — I10 Essential (primary) hypertension: Secondary | ICD-10-CM | POA: Diagnosis not present

## 2021-05-24 MED ORDER — AMLODIPINE BESYLATE 2.5 MG PO TABS: 2.5000 mg | ORAL_TABLET | Freq: Every day | ORAL | 0 refills | Status: DC

## 2021-05-24 NOTE — ED Triage Notes (Signed)
Pt states " Im here for a prescription for blood pressure medicine". States seen for same have appointment with pcp in October.

## 2021-05-24 NOTE — ED Provider Notes (Signed)
[facility] EMERGENCY DEPARTMENT Provider Note   CSN: [id number] Arrival date & time: 05/24/21  1020     History Chief Complaint  Patient presents with   Hypertension    Lindsay Hess is a 25 y.o. female.  Lindsay Hess is a 25 y.o. female with a history of hypertension, otherwise healthy, who presents to the ED requesting a refill of her blood pressure medication.  Patient was seen in the ED at the end of July for elevated blood pressures, had a reassuring evaluation and was started on amlodipine 2.5 mg daily.  She has set up care with a new primary care provider but the soonest she can be seen is October and she is run out of her blood pressure medication.  She reports she can tell it is improving because when she has it and takes it regularly she does not have any headaches and when she is without it she tends to have headaches.  Denies a headache today but reports she has been out of her medication for about 3 weeks now.  Upcoming PCP appointment on October 11, but patient is requesting an additional refill of medication to get her through until she can see her regular doctor.  She reports she started checking her blood pressure again when she ran out of the medication and its been elevated into the 160s-170s at home.  No associated chest pain, shortness of breath, lower extremity swelling, visual changes, numbness or weakness.  Patient denies any side effects or issues with the medication.  No other aggravating or alleviating factors.   The history is provided by the patient and medical records.      History reviewed. No pertinent past medical history.  There are no problems to display for this patient.   History reviewed. No pertinent surgical history.   OB History   No obstetric history on file.     Family History  Problem Relation Age of Onset   Hypertension Mother    Diabetes Other    Hypertension Other     Social History   Tobacco Use   Smoking  status: Never   Smokeless tobacco: Never  Vaping Use   Vaping Use: Never used  Substance Use Topics   Alcohol use: Yes    Comment: occ   Drug use: No    Home Medications Prior to Admission medications   Medication Sig Start Date End Date Taking? Authorizing Provider  amLODipine (NORVASC) 2.5 MG tablet Take 1 tablet (2.5 mg total) by mouth daily. 03/25/21 04/24/21  Melene Plan, DO  azithromycin (ZITHROMAX) 250 MG tablet Take 1 tablet (250 mg total) by mouth daily. Take first 2 tablets together, then 1 every day until finished. 01/07/16   Roxy Horseman, PA-C  metroNIDAZOLE (METROGEL VAGINAL) 0.75 % vaginal gel Place 1 Applicatorful vaginally 2 (two) times daily. Use for 1 week 05/08/16   Antony Madura, PA-C    Allergies    Patient has no known allergies.  Review of Systems   Review of Systems  Constitutional:  Negative for chills and fever.  Eyes:  Negative for visual disturbance.  Respiratory:  Negative for shortness of breath.   Cardiovascular:  Negative for chest pain.  Gastrointestinal:  Negative for abdominal pain.  Genitourinary:  Negative for dysuria.  Musculoskeletal:  Negative for arthralgias and myalgias.  Skin:  Negative for color change and rash.  Neurological:  Positive for headaches. Negative for weakness and numbness.   Physical Exam Updated Vital Signs BP (!) 169/117 (  BP Location: Left Arm)   Pulse 94   Temp 98.2 F (36.8 C) (Oral)   Resp 16   Ht 5\' 6"  (1.676 m)   Wt 113.4 kg   SpO2 99%   BMI 40.35 kg/m   Physical Exam Vitals and nursing note reviewed.  Constitutional:      General: She is not in acute distress.    Appearance: Normal appearance. She is well-developed and normal weight. She is not ill-appearing or diaphoretic.  HENT:     Head: Normocephalic and atraumatic.  Eyes:     General:        Right eye: No discharge.        Left eye: No discharge.  Cardiovascular:     Rate and Rhythm: Normal rate and regular rhythm.     Pulses: Normal  pulses.     Heart sounds: Normal heart sounds.  Pulmonary:     Effort: Pulmonary effort is normal. No respiratory distress.     Breath sounds: Normal breath sounds. No wheezing or rales.     Comments: Respirations equal and unlabored, patient able to speak in full sentences, lungs clear to auscultation bilaterally  Abdominal:     General: There is no distension.     Comments: Abdomen soft, nondistended, nontender to palpation in all quadrants without guarding or peritoneal signs  Musculoskeletal:        General: No deformity.     Right lower leg: No edema.     Left lower leg: No edema.  Skin:    General: Skin is warm and dry.     Capillary Refill: Capillary refill takes less than 2 seconds.  Neurological:     Mental Status: She is alert and oriented to person, place, and time.     Coordination: Coordination normal.     Comments: Speech is clear, able to follow commands Moves extremities without ataxia, coordination intact  Psychiatric:        Mood and Affect: Mood normal.        Behavior: Behavior normal.    ED Results / Procedures / Treatments   Labs (all labs ordered are listed, but only abnormal results are displayed) Labs Reviewed - No data to display  EKG None  Radiology No results found.  Procedures Procedures   Medications Ordered in ED Medications - No data to display  ED Course  I have reviewed the triage vital signs and the nursing notes.  Pertinent labs & imaging results that were available during my care of the patient were reviewed by me and considered in my medical decision making (see chart for details).    MDM Rules/Calculators/A&P                           25 year old female presents requesting refill of blood pressure medication until she can see her new PCP in a few weeks.  She was started on medication in July and has been tolerating it well without side effects and reports an improvement in her blood pressure while on it.  She is hypertensive  today but has been out of this medication for about 3 weeks.  Patient has not been keeping a record of her blood pressure while on the medication so we will hold off on making any dose adjustments.  Patient represcribed amlodipine.  She has been instructed to check her blood pressure in the afternoon after taking this medication to help her new PCP make any medication  changes or dosing adjustments.  Discussed appropriate return precautions.  She expresses understanding and agreement.  Discharged home in good condition.  Final Clinical Impression(s) / ED Diagnoses Final diagnoses:  Hypertension, unspecified type  Medication refill    Rx / DC Orders ED Discharge Orders     None        Dartha Lodge, [place] 05/25/21 1610    Maia Plan, MD 05/28/21 1102

## 2021-05-24 NOTE — ED Notes (Signed)
Patient here for BP med refill, denies any other complaints.

## 2021-05-24 NOTE — Discharge Instructions (Addendum)
Continue taking BP medication daily, record your BP daily in the afternoon to help you PCP determine if you need to increase your dose.

## 2021-06-08 ENCOUNTER — Ambulatory Visit: Payer: No Typology Code available for payment source | Admitting: Nurse Practitioner

## 2021-06-08 ENCOUNTER — Encounter: Payer: Self-pay | Admitting: Nurse Practitioner

## 2021-06-08 ENCOUNTER — Other Ambulatory Visit: Payer: Self-pay

## 2021-06-08 VITALS — BP 160/92 | HR 93 | Temp 97.8°F | Ht 65.2 in | Wt 251.8 lb

## 2021-06-08 DIAGNOSIS — E661 Drug-induced obesity: Secondary | ICD-10-CM

## 2021-06-08 DIAGNOSIS — Z6841 Body Mass Index (BMI) 40.0 and over, adult: Secondary | ICD-10-CM

## 2021-06-08 DIAGNOSIS — I10 Essential (primary) hypertension: Secondary | ICD-10-CM | POA: Diagnosis not present

## 2021-06-08 DIAGNOSIS — R7303 Prediabetes: Secondary | ICD-10-CM

## 2021-06-08 DIAGNOSIS — Z7689 Persons encountering health services in other specified circumstances: Secondary | ICD-10-CM

## 2021-06-08 DIAGNOSIS — Z2821 Immunization not carried out because of patient refusal: Secondary | ICD-10-CM

## 2021-06-08 MED ORDER — AMLODIPINE BESYLATE 5 MG PO TABS: 5.0000 mg | ORAL_TABLET | Freq: Every day | ORAL | 11 refills | Status: DC

## 2021-06-08 NOTE — Patient Instructions (Signed)

## 2021-06-08 NOTE — Progress Notes (Signed)
I,[name],acting as a Education administrator for [employer], NP.,have documented all relevant documentation on the behalf of [employer], NP,as directed by  Bary Castilla, NP while in the presence of Bary Castilla, NP.  This visit occurred during the SARS-CoV-2 public health emergency.  Safety protocols were in place, including screening questions prior to the visit, additional usage of staff PPE, and extensive cleaning of exam room while observing appropriate contact time as indicated for disinfecting solutions.  Subjective:     Patient ID: Lindsay Hess , female    DOB: [birth year] , 25 y.o.   MRN: [id number]   Chief Complaint  Patient presents with   Establish Care         HPI  Patient is here to establish care. She would like to have her BP addressed. She has only been treated by the ED for this issue. She thinks that she needs something stronger for her BP. Denies chest pain or SOB  BP Readings from Last 3 Encounters: 06/08/21 : (!) 160/92 05/24/21 : (!) 169/117 03/25/21 : (!) 140/115      Past Medical History:  Diagnosis Date   Hypertension      Family History  Problem Relation Age of Onset   Hypertension Mother    Diabetes Father    Hypertension Maternal Grandmother    Diabetes Other    Hypertension Other      Current Outpatient Medications:    amLODipine (NORVASC) 5 MG tablet, Take 1 tablet (5 mg total) by mouth daily., Disp: 30 tablet, Rfl: 11   No Known Allergies   Review of Systems  Constitutional: Negative.  Negative for chills, fatigue and fever.  HENT:  Negative for congestion.   Respiratory: Negative.  Negative for cough, chest tightness, shortness of breath and wheezing.   Cardiovascular: Negative.  Negative for chest pain and palpitations.  Gastrointestinal: Negative.   Musculoskeletal:  Negative for arthralgias.  Neurological:  Positive for headaches.    Today's Vitals   06/08/21 1228  BP: (!) 160/92  Pulse: 93  Temp: 97.8 F  (36.6 C)  TempSrc: Oral  Weight: 251 lb 12.8 oz (114.2 kg)  Height: 5' 5.2" (1.656 m)   Body mass index is 41.65 kg/m.  Wt Readings from Last 3 Encounters:  06/08/21 251 lb 12.8 oz (114.2 kg)  05/24/21 250 lb (113.4 kg)  03/25/21 250 lb (113.4 kg)    Objective:  Physical Exam Constitutional:      Appearance: Normal appearance.  HENT:     Head: Normocephalic and atraumatic.  Cardiovascular:     Rate and Rhythm: Normal rate and regular rhythm.     Pulses: Normal pulses.     Heart sounds: Normal heart sounds. No murmur heard. Pulmonary:     Effort: Pulmonary effort is normal. No respiratory distress.     Breath sounds: Normal breath sounds. No wheezing.  Skin:    General: Skin is warm and dry.     Capillary Refill: Capillary refill takes less than 2 seconds.  Neurological:     Mental Status: She is alert and oriented to person, place, and time.        Assessment And Plan:     1. Encounter to establish care with new doctor --Patient is here to establish care. Martin Majestic over patient medical, family, social and surgical history. -Reviewed with patient their medications and any allergies  -Reviewed with patient their sexual orientation, drug/tobacco and alcohol use -Dicussed any new concerns with patient  -recommended patient comes in  for a physical exam and complete blood work.  -Educated patient about the importance of annual screenings and immunizations.  -Advised patient to eat a healthy diet along with exercise for atleast 30-45 min atleast 4-5 days of the week.    2. Essential hypertension, benign -She has been taking 2.5 mg since 9/26. She was in the ED for hypertension. I have advised the patient to keep a BP log for a week. And send it through mychart. We will increase if needed. I have increased the dose from 2.5 mg to 5 mg. Patient is instructed to start 5 mg today.  -I have advised patient to eliminate stress and salt intake.  -Limit the intake of processed foods  and salt intake. You should increase your intake of green vegetables and fruits. Limit the use of alcohol. Limit fast foods and fried foods. Avoid high fatty saturated and trans fat foods. Keep yourself hydrated with drinking water. Avoid red meats. Eat lean meats instead. Exercise for atleast 30-45 min for atleast 4-5 times a week.  - amLODipine (NORVASC) 5 MG tablet; Take 1 tablet (5 mg total) by mouth daily.  Dispense: 30 tablet; Refill: 11 - CMP14+EGFR - CBC no Diff  3. Influenza vaccination declined -Given the risks for the flu.   4. Prediabetes - Hemoglobin A1c  5. 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 Increase the intake of lean meats including grilled chicken and [place].  Drink a lot of water. Decrease intake of fatty foods. Exercise for 30-45 min. 4-5 a week to decrease the risk of cardiac event.   The patient was encouraged to call or send a message through [place] for any questions or concerns.   Follow up: if symptoms persist or do not get better.   Side effects and appropriate use of all the medication(s) were discussed with the patient today. Patient advised to use the medication(s) as directed by their healthcare provider. The patient was encouraged to read, review, and understand all associated package inserts and contact our office with any questions or concerns. The patient accepts the risks of the treatment plan and had an opportunity to ask questions.   Staying healthy and adopting a healthy lifestyle for your overall health is important. You should eat 7 or more servings of fruits and vegetables per day. You should drink plenty of water to keep yourself hydrated and your kidneys healthy. This includes about 65-80+ fluid ounces of water. Limit your intake of animal fats especially for elevated cholesterol. Avoid highly processed  food and limit your salt intake if you have hypertension. Avoid foods high in saturated/Trans fats. Along with a healthy diet it is also very important to maintain time for yourself to maintain a healthy mental health with low stress levels. You should get atleast 150 min of moderate intensity exercise weekly for a healthy heart. Along with eating right and exercising, aim for at least 7-9 hours of sleep daily.  Eat more whole grains which includes barley, wheat berries, oats, brown rice and whole wheat pasta. Use healthy plant oils which include olive, soy, corn, sunflower and peanut. Limit your caffeine and sugary drinks. Limit your intake of fast foods. Limit milk and dairy products to one or two daily servings.   Patient was given opportunity to ask questions. Patient verbalized understanding of the plan and was able to repeat  key elements of the plan. All questions were answered to their satisfaction.  Raman Sigmond Patalano, DNP   I, Raman Ruger Saxer have reviewed all documentation for this visit. The documentation on 06/08/21 for the exam, diagnosis, procedures, and orders are all accurate and complete.    IF YOU HAVE BEEN REFERRED TO A SPECIALIST, IT MAY TAKE 1-2 WEEKS TO SCHEDULE/PROCESS THE REFERRAL. IF YOU HAVE NOT HEARD FROM US/SPECIALIST IN TWO WEEKS, PLEASE GIVE [place] A CALL AT [phone].   THE PATIENT IS ENCOURAGED TO PRACTICE SOCIAL DISTANCING DUE TO THE COVID-19 PANDEMIC.

## 2021-06-09 ENCOUNTER — Other Ambulatory Visit: Payer: Self-pay | Admitting: Nurse Practitioner

## 2021-06-09 DIAGNOSIS — D508 Other iron deficiency anemias: Secondary | ICD-10-CM

## 2021-06-09 LAB — CBC
Hematocrit: 30.1 % — ABNORMAL LOW (ref 34.0–46.6)
Hemoglobin: 8.9 g/dL — ABNORMAL LOW (ref 11.1–15.9)
MCH: 18.3 pg — ABNORMAL LOW (ref 26.6–33.0)
MCHC: 29.6 g/dL — ABNORMAL LOW (ref 31.5–35.7)
MCV: 62 fL — ABNORMAL LOW (ref 79–97)
Platelets: 342 10*3/uL (ref 150–450)
RBC: 4.86 x10E6/uL (ref 3.77–5.28)
RDW: 17.6 % — ABNORMAL HIGH (ref 11.7–15.4)
WBC: 5.9 10*3/uL (ref 3.4–10.8)

## 2021-06-09 LAB — CMP14+EGFR
ALT: 15 IU/L (ref 0–32)
AST: 17 IU/L (ref 0–40)
Albumin/Globulin Ratio: 1.6 (ref 1.2–2.2)
Albumin: 4.6 g/dL (ref 3.9–5.0)
Alkaline Phosphatase: 59 IU/L (ref 44–121)
BUN/Creatinine Ratio: 13 (ref 9–23)
BUN: 9 mg/dL (ref 6–20)
Bilirubin Total: 0.6 mg/dL (ref 0.0–1.2)
CO2: 21 mmol/L (ref 20–29)
Calcium: 9 mg/dL (ref 8.7–10.2)
Chloride: 102 mmol/L (ref 96–106)
Creatinine, Ser: 0.72 mg/dL (ref 0.57–1.00)
Globulin, Total: 2.9 g/dL (ref 1.5–4.5)
Glucose: 90 mg/dL (ref 70–99)
Potassium: 4 mmol/L (ref 3.5–5.2)
Sodium: 137 mmol/L (ref 134–144)
Total Protein: 7.5 g/dL (ref 6.0–8.5)
eGFR: 118 mL/min/{1.73_m2} (ref >59)

## 2021-06-09 LAB — HEMOGLOBIN A1C
Est. average glucose Bld gHb Est-mCnc: 126 mg/dL
Hgb A1c MFr Bld: 6 % — ABNORMAL HIGH (ref 4.8–5.6)

## 2021-06-09 MED ORDER — FERROUS SULFATE 325 (65 FE) MG PO TABS: 325.0000 mg | ORAL_TABLET | Freq: Every day | ORAL | 2 refills | Status: DC

## 2021-09-08 ENCOUNTER — Encounter: Payer: No Typology Code available for payment source | Admitting: Nurse Practitioner

## 2022-01-19 LAB — LIPID PANEL
Cholesterol: 138 (ref 0–200)
HDL: 60 (ref 35–70)
LDL Cholesterol: 63
Triglycerides: 76 (ref 40–160)

## 2022-01-19 LAB — HEMOGLOBIN A1C: Hemoglobin A1C: 5.9

## 2022-01-19 LAB — BASIC METABOLIC PANEL: Glucose: 80

## 2022-02-07 ENCOUNTER — Encounter: Payer: Self-pay | Admitting: Nurse Practitioner

## 2022-02-15 ENCOUNTER — Ambulatory Visit: Payer: PRIVATE HEALTH INSURANCE | Admitting: Nurse Practitioner

## 2022-02-15 ENCOUNTER — Other Ambulatory Visit (HOSPITAL_COMMUNITY)
Admission: RE | Admit: 2022-02-15 | Discharge: 2022-02-15 | Disposition: A | Discharge status: Home / Self Care | Payer: PRIVATE HEALTH INSURANCE | Source: Ambulatory Visit | Attending: Nurse Practitioner | Admitting: Nurse Practitioner

## 2022-02-15 ENCOUNTER — Encounter: Payer: Self-pay | Admitting: Nurse Practitioner

## 2022-02-15 VITALS — BP 142/86 | HR 82 | Temp 98.7°F | Ht 65.2 in | Wt 248.0 lb

## 2022-02-15 DIAGNOSIS — Z0000 Encounter for general adult medical examination without abnormal findings: Secondary | ICD-10-CM

## 2022-02-15 DIAGNOSIS — E66813 Obesity, class 3: Secondary | ICD-10-CM

## 2022-02-15 DIAGNOSIS — E661 Drug-induced obesity: Secondary | ICD-10-CM

## 2022-02-15 DIAGNOSIS — Z113 Encounter for screening for infections with a predominantly sexual mode of transmission: Secondary | ICD-10-CM

## 2022-02-15 DIAGNOSIS — D508 Other iron deficiency anemias: Secondary | ICD-10-CM | POA: Diagnosis not present

## 2022-02-15 DIAGNOSIS — Z1159 Encounter for screening for other viral diseases: Secondary | ICD-10-CM

## 2022-02-15 DIAGNOSIS — Z124 Encounter for screening for malignant neoplasm of cervix: Secondary | ICD-10-CM | POA: Diagnosis present

## 2022-02-15 DIAGNOSIS — I10 Essential (primary) hypertension: Secondary | ICD-10-CM

## 2022-02-15 DIAGNOSIS — Z114 Encounter for screening for human immunodeficiency virus [HIV]: Secondary | ICD-10-CM

## 2022-02-15 DIAGNOSIS — R7303 Prediabetes: Secondary | ICD-10-CM

## 2022-02-15 DIAGNOSIS — Z13228 Encounter for screening for other metabolic disorders: Secondary | ICD-10-CM

## 2022-02-15 DIAGNOSIS — Z6841 Body Mass Index (BMI) 40.0 and over, adult: Secondary | ICD-10-CM

## 2022-02-15 MED ORDER — AMLODIPINE BESYLATE 10 MG PO TABS: 10.0000 mg | ORAL_TABLET | Freq: Every day | ORAL | 1 refills | Status: DC

## 2022-02-15 NOTE — Patient Instructions (Signed)
Hypertension, Adult High blood pressure (hypertension) is when the force of blood pumping through the arteries is too strong. The arteries are the blood vessels that carry blood from the heart throughout the body. Hypertension forces the heart to work harder to pump blood and may cause arteries to become narrow or stiff. Untreated or uncontrolled hypertension can lead to a heart attack, heart failure, a stroke, kidney disease, and other problems. A blood pressure reading consists of a higher number over a lower number. Ideally, your blood pressure should be below 120/80. The first ("top") number is called the systolic pressure. It is a measure of the pressure in your arteries as your heart beats. The second ("bottom") number is called the diastolic pressure. It is a measure of the pressure in your arteries as the heart relaxes. What are the causes? The exact cause of this condition is not known. There are some conditions that result in high blood pressure. What increases the risk? Certain factors may make you more likely to develop high blood pressure. Some of these risk factors are under your control, including: Smoking. Not getting enough exercise or physical activity. Being overweight. Having too much fat, sugar, calories, or salt (sodium) in your diet. Drinking too much alcohol. Other risk factors include: Having a personal history of heart disease, diabetes, high cholesterol, or kidney disease. Stress. Having a family history of high blood pressure and high cholesterol. Having obstructive sleep apnea. Age. The risk increases with age. What are the signs or symptoms? High blood pressure may not cause symptoms. Very high blood pressure (hypertensive crisis) may cause: Headache. Fast or irregular heartbeats (palpitations). Shortness of breath. Nosebleed. Nausea and vomiting. Vision changes. Severe chest pain, dizziness, and seizures. How is this diagnosed? This condition is diagnosed by  measuring your blood pressure while you are seated, with your arm resting on a flat surface, your legs uncrossed, and your feet flat on the floor. The cuff of the blood pressure monitor will be placed directly against the skin of your upper arm at the level of your heart. Blood pressure should be measured at least twice using the same arm. Certain conditions can cause a difference in blood pressure between your right and left arms. If you have a high blood pressure reading during one visit or you have normal blood pressure with other risk factors, you may be asked to: Return on a different day to have your blood pressure checked again. Monitor your blood pressure at home for 1 week or longer. If you are diagnosed with hypertension, you may have other blood or imaging tests to help your health care provider understand your overall risk for other conditions. How is this treated? This condition is treated by making healthy lifestyle changes, such as eating healthy foods, exercising more, and reducing your alcohol intake. You may be referred for counseling on a healthy diet and physical activity. Your health care provider may prescribe medicine if lifestyle changes are not enough to get your blood pressure under control and if: Your systolic blood pressure is above 130. Your diastolic blood pressure is above 80. Your personal target blood pressure may vary depending on your medical conditions, your age, and other factors. Follow these instructions at home: Eating and drinking  Eat a diet that is high in fiber and potassium, and low in sodium, added sugar, and fat. An example of this eating plan is called the DASH diet. DASH stands for Dietary Approaches to Stop Hypertension. To eat this way: Eat   plenty of fresh fruits and vegetables. Try to fill one half of your plate at each meal with fruits and vegetables. Eat whole grains, such as whole-wheat pasta, brown rice, or whole-grain bread. Fill about one  fourth of your plate with whole grains. Eat or drink low-fat dairy products, such as skim milk or low-fat yogurt. Avoid fatty cuts of meat, processed or cured meats, and poultry with skin. Fill about one fourth of your plate with lean proteins, such as fish, chicken without skin, beans, eggs, or tofu. Avoid pre-made and processed foods. These tend to be higher in sodium, added sugar, and fat. Reduce your daily sodium intake. Many people with hypertension should eat less than 1,500 mg of sodium a day. Do not drink alcohol if: Your health care provider tells you not to drink. You are pregnant, may be pregnant, or are planning to become pregnant. If you drink alcohol: Limit how much you have to: 0-1 drink a day for women. 0-2 drinks a day for men. Know how much alcohol is in your drink. In the [place] one drink equals one 12 oz bottle of beer (355 mL), one 5 oz glass of wine (148 mL), or one 1 oz glass of hard liquor (44 mL). Lifestyle  Work with your health care provider to maintain a healthy body weight or to lose weight. Ask what an ideal weight is for you. Get at least 30 minutes of exercise that causes your heart to beat faster (aerobic exercise) most days of the week. Activities may include walking, swimming, or biking. Include exercise to strengthen your muscles (resistance exercise), such as Pilates or lifting weights, as part of your weekly exercise routine. Try to do these types of exercises for 30 minutes at least 3 days a week. Do not use any products that contain nicotine or tobacco. These products include cigarettes, chewing tobacco, and vaping devices, such as e-cigarettes. If you need help quitting, ask your health care provider. Monitor your blood pressure at home as told by your health care provider. Keep all follow-up visits. This is important. Medicines Take over-the-counter and prescription medicines only as told by your health care provider. Follow directions carefully. Blood  pressure medicines must be taken as prescribed. Do not skip doses of blood pressure medicine. Doing this puts you at risk for problems and can make the medicine less effective. Ask your health care provider about side effects or reactions to medicines that you should watch for. Contact a health care provider if you: Think you are having a reaction to a medicine you are taking. Have headaches that keep coming back (recurring). Feel dizzy. Have swelling in your ankles. Have trouble with your vision. Get help right away if you: Develop a severe headache or confusion. Have unusual weakness or numbness. Feel faint. Have severe pain in your chest or abdomen. Vomit repeatedly. Have trouble breathing. These symptoms may be an emergency. Get help right away. Call 911. Do not wait to see if the symptoms will go away. Do not drive yourself to the hospital. Summary Hypertension is when the force of blood pumping through your arteries is too strong. If this condition is not controlled, it may put you at risk for serious complications. Your personal target blood pressure may vary depending on your medical conditions, your age, and other factors. For most people, a normal blood pressure is less than 120/80. Hypertension is treated with lifestyle changes, medicines, or a combination of both. Lifestyle changes include losing weight, eating a healthy,   low-sodium diet, exercising more, and limiting alcohol. This information is not intended to replace advice given to you by your health care provider. Make sure you discuss any questions you have with your health care provider. Document Revised: 06/22/2021 Document Reviewed: 06/22/2021 Elsevier Patient Education  2023 Elsevier Inc.  

## 2022-02-15 NOTE — Progress Notes (Signed)
I,Lindsay Hess,acting as a Education administrator for [employer], FNP.,have documented all relevant documentation on the behalf of Lindsay Brine, FNP,as directed by  Lindsay Brine, FNP while in the presence of Lindsay Hess, [place].  Subjective:     Patient ID: Lindsay Hess , female    DOB: Dec 24, 1995 , 26 y.o.   MRN: [id number]   Chief Complaint  Patient presents with   Hypertension    HPI  Patient presents for HM. She does not have a GYN.   Hypertension This is a chronic problem. The current episode started more than 1 year ago. The problem is uncontrolled. Pertinent negatives include no anxiety. Risk factors for coronary artery disease include obesity and sedentary lifestyle.     Past Medical History:  Diagnosis Date   Hypertension      Family History  Problem Relation Age of Onset   Hypertension Mother    Diabetes Father    Hypertension Maternal Grandmother    Diabetes Other    Hypertension Other      Current Outpatient Medications:    ferrous sulfate (FERROUSUL) 325 (65 FE) MG tablet, Take 1 tablet (325 mg total) by mouth daily with breakfast., Disp: 30 tablet, Rfl: 2   amLODipine (NORVASC) 10 MG tablet, Take 1 tablet (10 mg total) by mouth daily., Disp: 90 tablet, Rfl: 1   No Known Allergies    The patient states she uses none for birth control.  Patient's last menstrual period was 02/05/2022.   Negative for Dysmenorrhea and Negative for Menorrhagia. Negative for: breast discharge, breast lump(s), breast pain and breast self exam. Associated symptoms include abnormal vaginal bleeding. Pertinent negatives include abnormal bleeding (hematology), anxiety, decreased libido, depression, difficulty falling sleep, dyspareunia, history of infertility, nocturia, sexual dysfunction, sleep disturbances, urinary incontinence, urinary urgency, vaginal discharge and vaginal itching. Diet regular; poor eating habits. The patient states her exercise level is none.   The patient's tobacco use is:   Social History   Tobacco Use  Smoking Status Never  Smokeless Tobacco Never   She has been exposed to passive smoke. The patient's alcohol use is:  Social History   Substance and Sexual Activity  Alcohol Use Yes   Comment: occ   Additional information: Last pap unknown date, next one scheduled for today.    Review of Systems  Constitutional: Negative.   HENT: Negative.    Eyes: Negative.   Respiratory: Negative.    Cardiovascular: Negative.   Gastrointestinal: Negative.   Endocrine: Negative.   Genitourinary: Negative.   Musculoskeletal: Negative.   Skin: Negative.   Allergic/Immunologic: Negative.   Neurological: Negative.   Hematological: Negative.   Psychiatric/Behavioral: Negative.       Today's Vitals   02/15/22 1453  BP: (!) 142/86  Pulse: 82  Temp: 98.7 F (37.1 C)  TempSrc: Oral  Weight: 248 lb (112.5 kg)  Height: 5' 5.2" (1.656 m)   Body mass index is 41.02 kg/m.  Wt Readings from Last 3 Encounters:  02/15/22 248 lb (112.5 kg)  06/08/21 251 lb 12.8 oz (114.2 kg)  05/24/21 250 lb (113.4 kg)    Objective:  Physical Exam Vitals reviewed.  Constitutional:      General: She is not in acute distress.    Appearance: Normal appearance. She is well-developed. She is obese.  HENT:     Head: Normocephalic and atraumatic.     Right Ear: Hearing, tympanic membrane, ear canal and external ear normal. There is no impacted cerumen.     Left Ear: Hearing,  tympanic membrane, ear canal and external ear normal. There is no impacted cerumen.     Nose:     Comments: Deferred - masked    Mouth/Throat:     Comments: Deferred - masked Eyes:     General: Lids are normal.     Extraocular Movements: Extraocular movements intact.     Conjunctiva/sclera: Conjunctivae normal.     Pupils: Pupils are equal, round, and reactive to light.     Funduscopic exam:    Right eye: No papilledema.        Left eye: No papilledema.  Neck:     Thyroid: No thyroid mass.      Vascular: No carotid bruit.  Cardiovascular:     Rate and Rhythm: Normal rate and regular rhythm.     Pulses: Normal pulses.     Heart sounds: Normal heart sounds. No murmur heard. Pulmonary:     Effort: Pulmonary effort is normal.     Breath sounds: Normal breath sounds.  Chest:     Chest wall: No mass.  Breasts:    Tanner Score is 5.     Right: Normal. No mass or tenderness.     Left: Normal. No mass or tenderness.  Abdominal:     General: Abdomen is flat. Bowel sounds are normal. There is no distension.     Palpations: Abdomen is soft.     Tenderness: There is no abdominal tenderness.     Hernia: There is no hernia in the left inguinal area or right inguinal area.  Genitourinary:    General: Normal vulva.     Exam position: Lithotomy position.     Tanner stage (genital): 5.     Labia:        Right: No rash.        Left: No rash.      Vagina: Normal. No vaginal discharge.     Cervix: Normal.     Uterus: Normal.      Adnexa: Right adnexa normal and left adnexa normal.     Rectum: Normal. Guaiac result negative.  Musculoskeletal:        General: No swelling. Normal range of motion.     Cervical back: Full passive range of motion without pain, normal range of motion and neck supple.     Right lower leg: No edema.     Left lower leg: No edema.  Lymphadenopathy:     Upper Body:     Right upper body: No supraclavicular, axillary or pectoral adenopathy.     Left upper body: No supraclavicular, axillary or pectoral adenopathy.  Skin:    General: Skin is warm and dry.     Capillary Refill: Capillary refill takes less than 2 seconds.  Neurological:     General: No focal deficit present.     Mental Status: She is alert and oriented to person, place, and time.     Cranial Nerves: No cranial nerve deficit.     Sensory: No sensory deficit.  Psychiatric:        Mood and Affect: Mood normal.        Behavior: Behavior normal.        Thought Content: Thought content normal.         Judgment: Judgment normal.         Assessment And Plan:     1. Encounter for annual physical exam Behavior modifications discussed and diet history reviewed.   Pt will continue to exercise regularly and modify diet  with low GI, plant based foods and decrease intake of processed foods.  Recommend intake of daily multivitamin, Vitamin D, and calcium.  Recommend for preventive screenings, as well as recommend immunizations that include influenza, TDAP  2. Essential hypertension, benign Comments: EKG done with NSR HR 92 - EKG 12-Lead - POCT Urinalysis Dipstick (81002) - Microalbumin / Creatinine Urine Ratio - amLODipine (NORVASC) 10 MG tablet; Take 1 tablet (10 mg total) by mouth daily.  Dispense: 90 tablet; Refill: 1  3. Encounter for hepatitis C screening test for low risk patient Will check Hepatitis C screening due to recent recommendations to screen all adults 18 years and older - Hepatitis C antibody  4. Screening for HIV without presence of risk factors - HIV Antibody (routine testing w rflx)  5. 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   6. Prediabetes Diet controlled, will check HgbA1c - Hemoglobin A1c - VITAMIN D 25 Hydroxy (Vit-D Deficiency, Fractures)  7. Other iron deficiency anemia - CBC - CMP14+EGFR - Iron, TIBC and Ferritin Panel  8. Encounter for screening for metabolic disorder - Lipid panel  9. Encounter for Papanicolaou smear of cervix PAP done, no abnormal findings - Cytology - PAP  10. Screening for STD (sexually transmitted disease) - Cervicovaginal ancillary only      Patient was given opportunity to ask questions. Patient verbalized understanding of the  plan and was able to repeat key elements of the plan. All questions were answered to their satisfaction.   Lindsay Brine, FNP   I, Lindsay Brine, FNP, have reviewed all documentation for this visit. The documentation on 02/15/22 for the exam, diagnosis, procedures, and orders are all accurate and complete.  THE PATIENT IS ENCOURAGED TO PRACTICE SOCIAL DISTANCING DUE TO THE COVID-19 PANDEMIC.

## 2022-02-15 NOTE — Progress Notes (Deleted)
  I,Simar Pothier,acting as a Neurosurgeon for [employer], FNP.,have documented all relevant documentation on the behalf of Arnette Felts, FNP,as directed by  Arnette Felts, FNP while in the presence of Arnette Felts, FNP.  This visit occurred during the SARS-CoV-2 public health emergency.  Safety protocols were in place, including screening questions prior to the visit, additional usage of staff PPE, and extensive cleaning of exam room while observing appropriate contact time as indicated for disinfecting solutions.  Subjective:     Patient ID: Lindsay Hess , female    DOB: 1995-11-02 , 26 y.o.   MRN: [id number]   Chief Complaint  Patient presents with   Hypertension    HPI  Patient presents today for HTN follow up.   Hypertension     Past Medical History:  Diagnosis Date   Hypertension      Family History  Problem Relation Age of Onset   Hypertension Mother    Diabetes Father    Hypertension Maternal Grandmother    Diabetes Other    Hypertension Other      Current Outpatient Medications:    ferrous sulfate (FERROUSUL) 325 (65 FE) MG tablet, Take 1 tablet (325 mg total) by mouth daily with breakfast., Disp: 30 tablet, Rfl: 2   amLODipine (NORVASC) 5 MG tablet, Take 1 tablet (5 mg total) by mouth daily., Disp: 30 tablet, Rfl: 11   No Known Allergies   Review of Systems  Constitutional: Negative.   Respiratory: Negative.    Cardiovascular: Negative.   Gastrointestinal: Negative.   Neurological: Negative.      Today's Vitals   02/15/22 1453  BP: (!) 142/86  Pulse: 82  Temp: 98.7 F (37.1 C)  TempSrc: Oral  Weight: 248 lb (112.5 kg)  Height: 5' 5.2" (1.656 m)   Body mass index is 41.02 kg/m.  Wt Readings from Last 3 Encounters:  02/15/22 248 lb (112.5 kg)  06/08/21 251 lb 12.8 oz (114.2 kg)  05/24/21 250 lb (113.4 kg)    Objective:  Physical Exam      Assessment And Plan:     1. Essential hypertension, benign  2. Class 3 drug-induced obesity with  serious comorbidity and body mass index (BMI) of 40.0 to 44.9 in adult [facility])  She is encouraged to strive for BMI less than 30 to decrease cardiac risk. Advised to aim for at least 150 minutes of exercise per week.    Patient was given opportunity to ask questions. Patient verbalized understanding of the plan and was able to repeat key elements of the plan. All questions were answered to their satisfaction.  [name]   I, [name], have reviewed all documentation for this visit. The documentation on 02/15/22 for the exam, diagnosis, procedures, and orders are all accurate and complete.   IF YOU HAVE BEEN REFERRED TO A SPECIALIST, IT MAY TAKE 1-2 WEEKS TO SCHEDULE/PROCESS THE REFERRAL. IF YOU HAVE NOT HEARD FROM US/SPECIALIST IN TWO WEEKS, PLEASE GIVE [place] A CALL AT [phone].   THE PATIENT IS ENCOURAGED TO PRACTICE SOCIAL DISTANCING DUE TO THE COVID-19 PANDEMIC.

## 2022-02-16 LAB — CBC
Hematocrit: 30.5 % — ABNORMAL LOW (ref 34.0–46.6)
Hemoglobin: 8.8 g/dL — ABNORMAL LOW (ref 11.1–15.9)
MCH: 18.3 pg — ABNORMAL LOW (ref 26.6–33.0)
MCHC: 28.9 g/dL — ABNORMAL LOW (ref 31.5–35.7)
MCV: 64 fL — ABNORMAL LOW (ref 79–97)
Platelets: 316 10*3/uL (ref 150–450)
RBC: 4.8 x10E6/uL (ref 3.77–5.28)
RDW: 17.8 % — ABNORMAL HIGH (ref 11.7–15.4)
WBC: 4.7 10*3/uL (ref 3.4–10.8)

## 2022-02-16 LAB — IRON,TIBC AND FERRITIN PANEL
Ferritin: 9 ng/mL — ABNORMAL LOW (ref 15–150)
Iron Saturation: 5 % — CL (ref 15–55)
Iron: 20 ug/dL — ABNORMAL LOW (ref 27–159)
Total Iron Binding Capacity: 415 ug/dL (ref 250–450)
UIBC: 395 ug/dL (ref 131–425)

## 2022-02-16 LAB — CMP14+EGFR
ALT: 16 IU/L (ref 0–32)
AST: 17 IU/L (ref 0–40)
Albumin/Globulin Ratio: 1.6 (ref 1.2–2.2)
Albumin: 4.4 g/dL (ref 3.9–5.0)
Alkaline Phosphatase: 54 IU/L (ref 44–121)
BUN/Creatinine Ratio: 14 (ref 9–23)
BUN: 11 mg/dL (ref 6–20)
Bilirubin Total: 0.5 mg/dL (ref 0.0–1.2)
CO2: 21 mmol/L (ref 20–29)
Calcium: 9 mg/dL (ref 8.7–10.2)
Chloride: 105 mmol/L (ref 96–106)
Creatinine, Ser: 0.78 mg/dL (ref 0.57–1.00)
Globulin, Total: 2.7 g/dL (ref 1.5–4.5)
Glucose: 99 mg/dL (ref 70–99)
Potassium: 3.8 mmol/L (ref 3.5–5.2)
Sodium: 141 mmol/L (ref 134–144)
Total Protein: 7.1 g/dL (ref 6.0–8.5)
eGFR: 107 mL/min/{1.73_m2} (ref >59)

## 2022-02-16 LAB — HEPATITIS C ANTIBODY: Hep C Virus Ab: NONREACTIVE

## 2022-02-16 LAB — HIV ANTIBODY (ROUTINE TESTING W REFLEX): HIV Screen 4th Generation wRfx: NONREACTIVE

## 2022-02-16 LAB — LIPID PANEL
Chol/HDL Ratio: 3 ratio (ref 0.0–4.4)
Cholesterol, Total: 155 mg/dL (ref 100–199)
HDL: 52 mg/dL (ref >39)
LDL Chol Calc (NIH): 84 mg/dL (ref 0–99)
Triglycerides: 106 mg/dL (ref 0–149)
VLDL Cholesterol Cal: 19 mg/dL (ref 5–40)

## 2022-02-16 LAB — VITAMIN D 25 HYDROXY (VIT D DEFICIENCY, FRACTURES): Vit D, 25-Hydroxy: 4.7 ng/mL — ABNORMAL LOW (ref 30.0–100.0)

## 2022-02-16 LAB — HEMOGLOBIN A1C
Est. average glucose Bld gHb Est-mCnc: 128 mg/dL
Hgb A1c MFr Bld: 6.1 % — ABNORMAL HIGH (ref 4.8–5.6)

## 2022-02-16 LAB — MICROALBUMIN / CREATININE URINE RATIO
Creatinine, Urine: 248.8 mg/dL
Microalb/Creat Ratio: 19 mg/g creat (ref 0–29)
Microalbumin, Urine: 46.6 ug/mL

## 2022-02-17 ENCOUNTER — Other Ambulatory Visit: Payer: Self-pay | Admitting: Nurse Practitioner

## 2022-02-17 DIAGNOSIS — R7303 Prediabetes: Secondary | ICD-10-CM

## 2022-02-17 DIAGNOSIS — E559 Vitamin D deficiency, unspecified: Secondary | ICD-10-CM

## 2022-02-17 DIAGNOSIS — D508 Other iron deficiency anemias: Secondary | ICD-10-CM

## 2022-02-17 LAB — CERVICOVAGINAL ANCILLARY ONLY
Candida Glabrata: NEGATIVE
Candida Vaginitis: NEGATIVE
Chlamydia: NEGATIVE
Comment: NEGATIVE
Comment: NEGATIVE
Comment: NEGATIVE
Comment: NEGATIVE
Comment: NORMAL
Neisseria Gonorrhea: NEGATIVE
Trichomonas: NEGATIVE

## 2022-02-17 MED ORDER — METFORMIN HCL 500 MG PO TABS: 500.0000 mg | ORAL_TABLET | Freq: Every day | ORAL | 3 refills | Status: DC

## 2022-02-17 MED ORDER — VITAMIN D (ERGOCALCIFEROL) 1.25 MG (50000 UNIT) PO CAPS: 50000.0000 [IU] | ORAL_CAPSULE | ORAL | 1 refills | Status: AC

## 2022-02-17 MED ORDER — FERROUS SULFATE 325 (65 FE) MG PO TABS: 325.0000 mg | ORAL_TABLET | Freq: Two times a day (BID) | ORAL | 2 refills | Status: DC

## 2022-02-21 LAB — CYTOLOGY - PAP: Diagnosis: NEGATIVE

## 2022-02-28 ENCOUNTER — Other Ambulatory Visit: Payer: Self-pay | Admitting: Nurse Practitioner

## 2022-02-28 ENCOUNTER — Ambulatory Visit: Payer: PRIVATE HEALTH INSURANCE

## 2022-02-28 VITALS — BP 130/100 | HR 89 | Temp 98.1°F | Ht 65.0 in | Wt 239.0 lb

## 2022-02-28 DIAGNOSIS — I10 Essential (primary) hypertension: Secondary | ICD-10-CM

## 2022-02-28 MED ORDER — HYDROCHLOROTHIAZIDE 12.5 MG PO TABS: 12.5000 mg | ORAL_TABLET | Freq: Every day | ORAL | 2 refills | Status: DC

## 2022-02-28 NOTE — Progress Notes (Signed)
Pt presents today for BPC. She reports taking her bp med at 430am before work, on the mornings she does work. When she does not have to work, the latest she will take her medication, 9am. She admits to changing her eating habits, salad, grilled meat or making meals at home. She does exercise now as well, but she thinks she pulled a muscle recently. Amlodipine 10mg  blood pressure medication currently.  BP Readings from Last 3 Encounters:  02/28/22 (!) 130/100  02/15/22 (!) 142/86  06/08/21 (!) 160/92  Provider notified, patient will start HCTZ 12.5mg  taken in the morning along with amlodipine. Provider will like to see her in 3 weeks, appointment scheduled medication sent.

## 2022-02-28 NOTE — Patient Instructions (Signed)
Hypertension, Adult ?Hypertension is another name for high blood pressure. High blood pressure forces your heart to work harder to pump blood. This can cause problems over time. ?There are two numbers in a blood pressure reading. There is a top number (systolic) over a bottom number (diastolic). It is best to have a blood pressure that is below 120/80. ?What are the causes? ?The cause of this condition is not known. Some other conditions can lead to high blood pressure. ?What increases the risk? ?Some lifestyle factors can make you more likely to develop high blood pressure: ?Smoking. ?Not getting enough exercise or physical activity. ?Being overweight. ?Having too much fat, sugar, calories, or salt (sodium) in your diet. ?Drinking too much alcohol. ?Other risk factors include: ?Having any of these conditions: ?Heart disease. ?Diabetes. ?High cholesterol. ?Kidney disease. ?Obstructive sleep apnea. ?Having a family history of high blood pressure and high cholesterol. ?Age. The risk increases with age. ?Stress. ?What are the signs or symptoms? ?High blood pressure may not cause symptoms. Very high blood pressure (hypertensive crisis) may cause: ?Headache. ?Fast or uneven heartbeats (palpitations). ?Shortness of breath. ?Nosebleed. ?Vomiting or feeling like you may vomit (nauseous). ?Changes in how you see. ?Very bad chest pain. ?Feeling dizzy. ?Seizures. ?How is this treated? ?This condition is treated by making healthy lifestyle changes, such as: ?Eating healthy foods. ?Exercising more. ?Drinking less alcohol. ?Your doctor may prescribe medicine if lifestyle changes do not help enough and if: ?Your top number is above 130. ?Your bottom number is above 80. ?Your personal target blood pressure may vary. ?Follow these instructions at home: ?Eating and drinking ? ?If told, follow the DASH eating plan. To follow this plan: ?Fill one half of your plate at each meal with fruits and vegetables. ?Fill one fourth of your plate  at each meal with whole grains. Whole grains include whole-wheat pasta, brown rice, and whole-grain bread. ?Eat or drink low-fat dairy products, such as skim milk or low-fat yogurt. ?Fill one fourth of your plate at each meal with low-fat (lean) proteins. Low-fat proteins include fish, chicken without skin, eggs, beans, and tofu. ?Avoid fatty meat, cured and processed meat, or chicken with skin. ?Avoid pre-made or processed food. ?Limit the amount of salt in your diet to less than 1,500 mg each day. ?Do not drink alcohol if: ?Your doctor tells you not to drink. ?You are pregnant, may be pregnant, or are planning to become pregnant. ?If you drink alcohol: ?Limit how much you have to: ?0-1 drink a day for women. ?0-2 drinks a day for men. ?Know how much alcohol is in your drink. In the [place] one drink equals one 12 oz bottle of beer (355 mL), one 5 oz glass of wine (148 mL), or one 1? oz glass of hard liquor (44 mL). ?Lifestyle ? ?Work with your doctor to stay at a healthy weight or to lose weight. Ask your doctor what the best weight is for you. ?Get at least 30 minutes of exercise that causes your heart to beat faster (aerobic exercise) most days of the week. This may include walking, swimming, or biking. ?Get at least 30 minutes of exercise that strengthens your muscles (resistance exercise) at least 3 days a week. This may include lifting weights or doing Pilates. ?Do not smoke or use any products that contain nicotine or tobacco. If you need help quitting, ask your doctor. ?Check your blood pressure at home as told by your doctor. ?Keep all follow-up visits. ?Medicines ?Take over-the-counter and prescription medicines   only as told by your doctor. Follow directions carefully. ?Do not skip doses of blood pressure medicine. The medicine does not work as well if you skip doses. Skipping doses also puts you at risk for problems. ?Ask your doctor about side effects or reactions to medicines that you should watch  for. ?Contact a doctor if: ?You think you are having a reaction to the medicine you are taking. ?You have headaches that keep coming back. ?You feel dizzy. ?You have swelling in your ankles. ?You have trouble with your vision. ?Get help right away if: ?You get a very bad headache. ?You start to feel mixed up (confused). ?You feel weak or numb. ?You feel faint. ?You have very bad pain in your: ?Chest. ?Belly (abdomen). ?You vomit more than once. ?You have trouble breathing. ?These symptoms may be an emergency. Get help right away. Call 911. ?Do not wait to see if the symptoms will go away. ?Do not drive yourself to the hospital. ?Summary ?Hypertension is another name for high blood pressure. ?High blood pressure forces your heart to work harder to pump blood. ?For most people, a normal blood pressure is less than 120/80. ?Making healthy choices can help lower blood pressure. If your blood pressure does not get lower with healthy choices, you may need to take medicine. ?This information is not intended to replace advice given to you by your health care provider. Make sure you discuss any questions you have with your health care provider. ?Document Revised: 06/03/2021 Document Reviewed: 06/03/2021 ?Elsevier Patient Education ? 2023 Elsevier Inc. ? ?

## 2022-03-21 ENCOUNTER — Ambulatory Visit: Payer: PRIVATE HEALTH INSURANCE | Admitting: Nurse Practitioner

## 2022-04-04 ENCOUNTER — Ambulatory Visit: Payer: PRIVATE HEALTH INSURANCE | Admitting: Nurse Practitioner

## 2022-06-20 ENCOUNTER — Ambulatory Visit: Payer: Managed Care, Other (non HMO) | Admitting: Nurse Practitioner

## 2022-06-20 ENCOUNTER — Encounter: Payer: Self-pay | Admitting: Nurse Practitioner

## 2022-06-20 VITALS — BP 130/80 | HR 80 | Temp 98.4°F | Ht 65.0 in | Wt 217.8 lb

## 2022-06-20 DIAGNOSIS — D508 Other iron deficiency anemias: Secondary | ICD-10-CM | POA: Diagnosis not present

## 2022-06-20 DIAGNOSIS — I10 Essential (primary) hypertension: Secondary | ICD-10-CM | POA: Diagnosis not present

## 2022-06-20 DIAGNOSIS — Z6836 Body mass index (BMI) 36.0-36.9, adult: Secondary | ICD-10-CM

## 2022-06-20 DIAGNOSIS — R7303 Prediabetes: Secondary | ICD-10-CM

## 2022-06-20 DIAGNOSIS — Z2821 Immunization not carried out because of patient refusal: Secondary | ICD-10-CM

## 2022-06-20 MED ORDER — METFORMIN HCL 500 MG PO TABS: 500.0000 mg | ORAL_TABLET | Freq: Every day | ORAL | 1 refills | Status: DC

## 2022-06-20 MED ORDER — ACCRUFER 30 MG PO CAPS
1.0000 | ORAL_CAPSULE | Freq: Every day | ORAL | 3 refills | Status: DC
Start: 2022-06-20 — End: 2023-04-20

## 2022-06-20 NOTE — Progress Notes (Signed)
Barnet Glasgow Martin,acting as a Education administrator for [name], FNP.,have documented all relevant documentation on the behalf of [name], FNP,as directed by  [name], FNP while in the presence of [name], [place].    Subjective:     Patient ID: Lindsay Hess , female    DOB: [birth year] , 26 y.o.   MRN: [id number]   Chief Complaint  Patient presents with   Hypertension    HPI  Patient presents today for a bp check. Patient states compliance with medications and has no other issues. Patient needs clarification on when to take amlodipine. BP Readings from Last 3 Encounters: 06/20/22 : (!) 150/80 02/28/22 : (!) 130/100 02/15/22 : (!) 142/86  Wt Readings from Last 3 Encounters: 06/20/22 : 217 lb 12.8 oz (98.8 kg) 02/28/22 : 239 lb (108.4 kg) 02/15/22 : 248 lb (112.5 kg)  She is exercising and walking. She is also eating better by cutting back on bad foods.      Past Medical History:  Diagnosis Date   Hypertension      Family History  Problem Relation Age of Onset   Hypertension Mother    Diabetes Father    Hypertension Maternal Grandmother    Diabetes Other    Hypertension Other      Current Outpatient Medications:    amLODipine (NORVASC) 10 MG tablet, Take 1 tablet (10 mg total) by mouth daily., Disp: 90 tablet, Rfl: 1   Ferric Maltol (ACCRUFER) 30 MG CAPS, Take 1 tablet by mouth daily., Disp: 30 capsule, Rfl: 3   hydrochlorothiazide (HYDRODIURIL) 12.5 MG tablet, Take 1 tablet (12.5 mg total) by mouth daily., Disp: 30 tablet, Rfl: 2   Vitamin D, Ergocalciferol, (DRISDOL) 1.25 MG (50000 UNIT) CAPS capsule, Take 1 capsule (50,000 Units total) by mouth every 7 (seven) days., Disp: 24 capsule, Rfl: 1   metFORMIN (GLUCOPHAGE) 500 MG tablet, Take 1 tablet (500 mg total) by mouth daily with breakfast., Disp: 90 tablet, Rfl: 1   No Known Allergies   Review of Systems  Constitutional: Negative.   HENT: Negative.    Eyes: Negative.   Respiratory: Negative.     Cardiovascular: Negative.   Gastrointestinal: Negative.   Psychiatric/Behavioral: Negative.       Today's Vitals   06/20/22 1445 06/20/22 1524  BP: (!) 150/80 130/80  Pulse: 80   Temp: 98.4 F (36.9 C)   TempSrc: Oral   Weight: 217 lb 12.8 oz (98.8 kg)   Height: 5' 5"  (1.651 m)   PainSc: 0-No pain    Body mass index is 36.24 kg/m.  Wt Readings from Last 3 Encounters:  06/20/22 217 lb 12.8 oz (98.8 kg)  02/28/22 239 lb (108.4 kg)  02/15/22 248 lb (112.5 kg)    Objective:  Physical Exam Vitals reviewed.  Constitutional:      General: She is not in acute distress.    Appearance: Normal appearance. She is obese.  Cardiovascular:     Rate and Rhythm: Normal rate and regular rhythm.     Pulses: Normal pulses.     Heart sounds: Normal heart sounds. No murmur heard. Pulmonary:     Effort: Pulmonary effort is normal. No respiratory distress.     Breath sounds: Normal breath sounds. No wheezing.  Skin:    General: Skin is warm and dry.     Capillary Refill: Capillary refill takes less than 2 seconds.  Neurological:     General: No focal deficit present.     Mental Status: She is  alert and oriented to person, place, and time.     Cranial Nerves: No cranial nerve deficit.     Motor: No weakness.        Assessment And Plan:     1. Essential hypertension, benign Comments: Blood pressure is fairly controlled however with the repeat blood pressure is better, continue current medications - BMP8+eGFR  2. Prediabetes Comments: HgbA1c is stable, continue metformin, tolerating well. - metFORMIN (GLUCOPHAGE) 500 MG tablet; Take 1 tablet (500 mg total) by mouth daily with breakfast.  Dispense: 90 tablet; Refill: 1 - Hemoglobin A1c  3. Iron deficiency anemia secondary to inadequate dietary iron intake Comments: I will switch her to accrufer due to constipation, continue to increase fluid intake. - Ferric Maltol (ACCRUFER) 30 MG CAPS; Take 1 tablet by mouth daily.  Dispense: 30  capsule; Refill: 3  4. Class 2 severe obesity due to excess calories with serious comorbidity and body mass index (BMI) of 36.0 to 36.9 in adult [facility]) Chronic Discussed healthy diet and regular exercise options  Encouraged to exercise at least 150 minutes per week with 2 days of strength training  5. Influenza vaccination declined Patient declined influenza vaccination at this time. Patient is aware that influenza vaccine prevents illness in 70% of healthy people, and reduces hospitalizations to 30-70% in elderly. This vaccine is recommended annually. Education has been provided regarding the importance of this vaccine but patient still declined. Advised may receive this vaccine at local pharmacy or Health Dept.or vaccine clinic. Aware to provide a copy of the vaccination record if obtained from local pharmacy or Health Dept.  Pt is willing to accept risk associated with refusing vaccination.     Patient was given opportunity to ask questions. Patient verbalized understanding of the plan and was able to repeat key elements of the plan. All questions were answered to their satisfaction.  [name], FNP   I, [name], FNP, have reviewed all documentation for this visit. The documentation on 10/23//23 for the exam, diagnosis, procedures, and orders are all accurate and complete.   IF YOU HAVE BEEN REFERRED TO A SPECIALIST, IT MAY TAKE 1-2 WEEKS TO SCHEDULE/PROCESS THE REFERRAL. IF YOU HAVE NOT HEARD FROM US/SPECIALIST IN TWO WEEKS, PLEASE GIVE [place] A CALL AT [phone].   THE PATIENT IS ENCOURAGED TO PRACTICE SOCIAL DISTANCING DUE TO THE COVID-19 PANDEMIC.

## 2022-06-20 NOTE — Patient Instructions (Signed)
Hypertension, Adult ?Hypertension is another name for high blood pressure. High blood pressure forces your heart to work harder to pump blood. This can cause problems over time. ?There are two numbers in a blood pressure reading. There is a top number (systolic) over a bottom number (diastolic). It is best to have a blood pressure that is below 120/80. ?What are the causes? ?The cause of this condition is not known. Some other conditions can lead to high blood pressure. ?What increases the risk? ?Some lifestyle factors can make you more likely to develop high blood pressure: ?Smoking. ?Not getting enough exercise or physical activity. ?Being overweight. ?Having too much fat, sugar, calories, or salt (sodium) in your diet. ?Drinking too much alcohol. ?Other risk factors include: ?Having any of these conditions: ?Heart disease. ?Diabetes. ?High cholesterol. ?Kidney disease. ?Obstructive sleep apnea. ?Having a family history of high blood pressure and high cholesterol. ?Age. The risk increases with age. ?Stress. ?What are the signs or symptoms? ?High blood pressure may not cause symptoms. Very high blood pressure (hypertensive crisis) may cause: ?Headache. ?Fast or uneven heartbeats (palpitations). ?Shortness of breath. ?Nosebleed. ?Vomiting or feeling like you may vomit (nauseous). ?Changes in how you see. ?Very bad chest pain. ?Feeling dizzy. ?Seizures. ?How is this treated? ?This condition is treated by making healthy lifestyle changes, such as: ?Eating healthy foods. ?Exercising more. ?Drinking less alcohol. ?Your doctor may prescribe medicine if lifestyle changes do not help enough and if: ?Your top number is above 130. ?Your bottom number is above 80. ?Your personal target blood pressure may vary. ?Follow these instructions at home: ?Eating and drinking ? ?If told, follow the DASH eating plan. To follow this plan: ?Fill one half of your plate at each meal with fruits and vegetables. ?Fill one fourth of your plate  at each meal with whole grains. Whole grains include whole-wheat pasta, brown rice, and whole-grain bread. ?Eat or drink low-fat dairy products, such as skim milk or low-fat yogurt. ?Fill one fourth of your plate at each meal with low-fat (lean) proteins. Low-fat proteins include fish, chicken without skin, eggs, beans, and tofu. ?Avoid fatty meat, cured and processed meat, or chicken with skin. ?Avoid pre-made or processed food. ?Limit the amount of salt in your diet to less than 1,500 mg each day. ?Do not drink alcohol if: ?Your doctor tells you not to drink. ?You are pregnant, may be pregnant, or are planning to become pregnant. ?If you drink alcohol: ?Limit how much you have to: ?0-1 drink a day for women. ?0-2 drinks a day for men. ?Know how much alcohol is in your drink. In the [place] one drink equals one 12 oz bottle of beer (355 mL), one 5 oz glass of wine (148 mL), or one 1? oz glass of hard liquor (44 mL). ?Lifestyle ? ?Work with your doctor to stay at a healthy weight or to lose weight. Ask your doctor what the best weight is for you. ?Get at least 30 minutes of exercise that causes your heart to beat faster (aerobic exercise) most days of the week. This may include walking, swimming, or biking. ?Get at least 30 minutes of exercise that strengthens your muscles (resistance exercise) at least 3 days a week. This may include lifting weights or doing Pilates. ?Do not smoke or use any products that contain nicotine or tobacco. If you need help quitting, ask your doctor. ?Check your blood pressure at home as told by your doctor. ?Keep all follow-up visits. ?Medicines ?Take over-the-counter and prescription medicines   only as told by your doctor. Follow directions carefully. ?Do not skip doses of blood pressure medicine. The medicine does not work as well if you skip doses. Skipping doses also puts you at risk for problems. ?Ask your doctor about side effects or reactions to medicines that you should watch  for. ?Contact a doctor if: ?You think you are having a reaction to the medicine you are taking. ?You have headaches that keep coming back. ?You feel dizzy. ?You have swelling in your ankles. ?You have trouble with your vision. ?Get help right away if: ?You get a very bad headache. ?You start to feel mixed up (confused). ?You feel weak or numb. ?You feel faint. ?You have very bad pain in your: ?Chest. ?Belly (abdomen). ?You vomit more than once. ?You have trouble breathing. ?These symptoms may be an emergency. Get help right away. Call 911. ?Do not wait to see if the symptoms will go away. ?Do not drive yourself to the hospital. ?Summary ?Hypertension is another name for high blood pressure. ?High blood pressure forces your heart to work harder to pump blood. ?For most people, a normal blood pressure is less than 120/80. ?Making healthy choices can help lower blood pressure. If your blood pressure does not get lower with healthy choices, you may need to take medicine. ?This information is not intended to replace advice given to you by your health care provider. Make sure you discuss any questions you have with your health care provider. ?Document Revised: 06/03/2021 Document Reviewed: 06/03/2021 ?Elsevier Patient Education ? 2023 Elsevier Inc. ? ?

## 2022-08-14 ENCOUNTER — Other Ambulatory Visit: Payer: Self-pay | Admitting: Nurse Practitioner

## 2022-08-14 DIAGNOSIS — I10 Essential (primary) hypertension: Secondary | ICD-10-CM

## 2022-10-24 ENCOUNTER — Ambulatory Visit: Payer: PRIVATE HEALTH INSURANCE | Admitting: Nurse Practitioner

## 2023-01-23 ENCOUNTER — Other Ambulatory Visit: Payer: Self-pay | Admitting: Nurse Practitioner

## 2023-01-23 DIAGNOSIS — R7303 Prediabetes: Secondary | ICD-10-CM

## 2023-03-01 ENCOUNTER — Other Ambulatory Visit: Payer: Self-pay | Admitting: Nurse Practitioner

## 2023-03-01 DIAGNOSIS — I10 Essential (primary) hypertension: Secondary | ICD-10-CM

## 2023-04-20 ENCOUNTER — Ambulatory Visit: Payer: Managed Care, Other (non HMO) | Admitting: Nurse Practitioner

## 2023-04-20 ENCOUNTER — Encounter: Payer: Self-pay | Admitting: Nurse Practitioner

## 2023-04-20 VITALS — BP 140/86 | HR 62 | Temp 98.5°F | Ht 65.0 in | Wt 222.8 lb

## 2023-04-20 DIAGNOSIS — I10 Essential (primary) hypertension: Secondary | ICD-10-CM | POA: Diagnosis not present

## 2023-04-20 DIAGNOSIS — Z6837 Body mass index (BMI) 37.0-37.9, adult: Secondary | ICD-10-CM

## 2023-04-20 DIAGNOSIS — R7303 Prediabetes: Secondary | ICD-10-CM

## 2023-04-20 DIAGNOSIS — D508 Other iron deficiency anemias: Secondary | ICD-10-CM | POA: Diagnosis not present

## 2023-04-20 DIAGNOSIS — E6609 Other obesity due to excess calories: Secondary | ICD-10-CM | POA: Insufficient documentation

## 2023-04-20 MED ORDER — HYDROCHLOROTHIAZIDE 12.5 MG PO TABS: 12.5000 mg | ORAL_TABLET | Freq: Every day | ORAL | 2 refills | Status: DC

## 2023-04-20 MED ORDER — METFORMIN HCL 500 MG PO TABS: 500.0000 mg | ORAL_TABLET | Freq: Every day | ORAL | 1 refills | Status: AC

## 2023-04-20 MED ORDER — AMLODIPINE BESYLATE 10 MG PO TABS: 10.0000 mg | ORAL_TABLET | Freq: Every day | ORAL | 1 refills | Status: DC

## 2023-04-20 MED ORDER — ACCRUFER 30 MG PO CAPS: 30.0000 mg | ORAL_CAPSULE | Freq: Every day | ORAL | 3 refills | Status: DC

## 2023-04-20 NOTE — Progress Notes (Addendum)
Madelaine Bhat, CMA,acting as a Neurosurgeon for Arnette Felts, FNP.,have documented all relevant documentation on the behalf of Arnette Felts, FNP,as directed by  Arnette Felts, FNP while in the presence of Arnette Felts, FNP.  Subjective:  Patient ID: Lindsay Hess , female    DOB: 02-21-1996 , 27 y.o.   MRN: [id number]  Chief Complaint  Patient presents with   Hypertension   Diabetes    HPI  Patient presented today for a BP and pre-dm follow up, patient reports she has been out of her medication for a few weeks.  Patient denies any chest pain, SOB, or headaches. Patient has no concerns today. She is exercising at [employer] 3-4 days a week. She is working at her job at [employer]. She is trying to lose weight and eating more a of a plant based diet.   BP Readings from Last 3 Encounters: 04/20/23 : (!) 160/102 06/20/22 : 130/80 02/28/22 : (!) 130/100   Wt Readings from Last 3 Encounters: 04/20/23 : 222 lb 12.8 oz (101.1 kg) 06/20/22 : 217 lb 12.8 oz (98.8 kg) 02/28/22 : 239 lb (108.4 kg)       Past Medical History:  Diagnosis Date   Hypertension      Family History  Problem Relation Age of Onset   Hypertension Mother    Diabetes Father    Hypertension Maternal Grandmother    Diabetes Other    Hypertension Other      Current Outpatient Medications:    Vitamin D, Ergocalciferol, (DRISDOL) 1.25 MG (50000 UNIT) CAPS capsule, Take 1 capsule (50,000 Units total) by mouth every 7 (seven) days., Disp: 24 capsule, Rfl: 1   amLODipine (NORVASC) 10 MG tablet, Take 1 tablet (10 mg total) by mouth daily., Disp: 90 tablet, Rfl: 1   Ferric Maltol (ACCRUFER) 30 MG CAPS, Take 1 capsule (30 mg total) by mouth daily., Disp: 30 capsule, Rfl: 3   hydrochlorothiazide (HYDRODIURIL) 12.5 MG tablet, Take 1 tablet (12.5 mg total) by mouth daily., Disp: 30 tablet, Rfl: 2   metFORMIN (GLUCOPHAGE) 500 MG tablet, Take 1 tablet (500 mg total) by mouth daily with breakfast., Disp: 90  tablet, Rfl: 1   No Known Allergies   Review of Systems  Constitutional: Negative.   HENT: Negative.    Eyes: Negative.   Respiratory: Negative.    Cardiovascular: Negative.   Gastrointestinal: Negative.        Abdomen bloating  Genitourinary: Negative.   Musculoskeletal: Negative.   Neurological: Negative.   Psychiatric/Behavioral: Negative.       Today's Vitals   04/20/23 1445 04/20/23 1528  BP: (!) 160/102 (!) 140/86  Pulse: 62   Temp: 98.5 F (36.9 C)   TempSrc: Oral   Weight: 222 lb 12.8 oz (101.1 kg)   Height: 5\' 5"  (1.651 m)   PainSc: 0-No pain    Body mass index is 37.08 kg/m.  Wt Readings from Last 3 Encounters:  04/20/23 222 lb 12.8 oz (101.1 kg)  06/20/22 217 lb 12.8 oz (98.8 kg)  02/28/22 239 lb (108.4 kg)     Objective:  Physical Exam Vitals reviewed.  Constitutional:      General: She is not in acute distress.    Appearance: Normal appearance. She is obese.  Cardiovascular:     Rate and Rhythm: Normal rate and regular rhythm.     Pulses: Normal pulses.     Heart sounds: Normal heart sounds. No murmur heard. Pulmonary:     Effort: Pulmonary effort  is normal. No respiratory distress.     Breath sounds: Normal breath sounds. No wheezing.  Skin:    General: Skin is warm and dry.     Capillary Refill: Capillary refill takes less than 2 seconds.  Neurological:     General: No focal deficit present.     Mental Status: She is alert and oriented to person, place, and time.     Cranial Nerves: No cranial nerve deficit.     Motor: No weakness.         Assessment And Plan:  Uncontrolled hypertension Assessment & Plan: Blood pressure is elevated, repeat is better but still elevated. She is advised she needs to follow up regularly and adherence to medication treatment  Orders: -     Basic metabolic panel -     amLODIPine Besylate; Take 1 tablet (10 mg total) by mouth daily.  Dispense: 90 tablet; Refill: 1  Prediabetes Assessment & Plan: Will  check HgbA1c. Continue focusing on healthy diet  Orders: -     Hemoglobin A1c -     metFORMIN HCl; Take 1 tablet (500 mg total) by mouth daily with breakfast.  Dispense: 90 tablet; Refill: 1  Iron deficiency anemia secondary to inadequate dietary iron intake -     ACCRUFeR; Take 1 capsule (30 mg total) by mouth daily.  Dispense: 30 capsule; Refill: 3  Class 2 obesity due to excess calories with body mass index (BMI) of 37.0 to 37.9 in adult, unspecified whether serious comorbidity present Assessment & Plan: She is encouraged to strive for BMI less than 30 to decrease cardiac risk. Advised to aim for at least 150 minutes of exercise per week.    Other orders -     hydroCHLOROthiazide; Take 1 tablet (12.5 mg total) by mouth daily.  Dispense: 30 tablet; Refill: 2    Return for uncontrolled bp and HM 3-7months check.  Patient was given opportunity to ask questions. Patient verbalized understanding of the plan and was able to repeat key elements of the plan. All questions were answered to their satisfaction.    Jeanell Sparrow, FNP, have reviewed all documentation for this visit. The documentation on 04/20/23 for the exam, diagnosis, procedures, and orders are all accurate and complete.   IF YOU HAVE BEEN REFERRED TO A SPECIALIST, IT MAY TAKE 1-2 WEEKS TO SCHEDULE/PROCESS THE REFERRAL. IF YOU HAVE NOT HEARD FROM US/SPECIALIST IN TWO WEEKS, PLEASE GIVE [place] A CALL AT [phone].

## 2023-04-21 LAB — BASIC METABOLIC PANEL
BUN/Creatinine Ratio: 14 (ref 9–23)
BUN: 9 mg/dL (ref 6–20)
CO2: 22 mmol/L (ref 20–29)
Calcium: 9.1 mg/dL (ref 8.7–10.2)
Chloride: 103 mmol/L (ref 96–106)
Creatinine, Ser: 0.65 mg/dL (ref 0.57–1.00)
Glucose: 85 mg/dL (ref 70–99)
Potassium: 4.1 mmol/L (ref 3.5–5.2)
Sodium: 139 mmol/L (ref 134–144)
eGFR: 123 mL/min/{1.73_m2} (ref >59)

## 2023-04-21 LAB — HEMOGLOBIN A1C
Est. average glucose Bld gHb Est-mCnc: 123 mg/dL
Hgb A1c MFr Bld: 5.9 % — ABNORMAL HIGH (ref 4.8–5.6)

## 2023-04-28 NOTE — Assessment & Plan Note (Addendum)
Blood pressure is elevated, repeat is better but still elevated. She is advised she needs to follow up regularly and adherence to medication treatment

## 2023-04-28 NOTE — Assessment & Plan Note (Signed)
Will check HgbA1c. Continue focusing on healthy diet

## 2023-04-28 NOTE — Assessment & Plan Note (Signed)
She is encouraged to strive for BMI less than 30 to decrease cardiac risk. Advised to aim for at least 150 minutes of exercise per week.  

## 2023-05-12 ENCOUNTER — Ambulatory Visit: Payer: Managed Care, Other (non HMO)

## 2023-05-12 ENCOUNTER — Encounter: Payer: Self-pay | Admitting: Nurse Practitioner

## 2023-05-12 VITALS — BP 142/88 | HR 99 | Temp 98.1°F | Ht 65.0 in | Wt 222.0 lb

## 2023-05-12 DIAGNOSIS — I10 Essential (primary) hypertension: Secondary | ICD-10-CM

## 2023-05-12 NOTE — Progress Notes (Signed)
Patient presents today for bpc. She reports taking Amlodipine 10 MG in the morning before work. Hydrochlorothiazide 12.5MG   she reports taking in the morning as well. She admits only taking the hydrochlorothiazide on this past Monday. She admits not taking medication since then. She reports drinking plentiful amounts of water. She also states exercising regularly.  Initial BP: 140/90. BP Readings from Last 3 Encounters:  05/12/23 (!) 142/88  04/20/23 (!) 140/86  06/20/22 130/80  Per provider patient is to take    hydrochlorothiazide everyday, increase potassium intake along with water. Patient aware to come back in 2 weeks for bpc & kidney function check. Appointment scheduled.

## 2023-05-12 NOTE — Progress Notes (Signed)
Patient presents today for a a bp check, patient currently taking amLODipine 10mg , hydrochlorothiazide 12.5mg .

## 2023-05-26 ENCOUNTER — Ambulatory Visit: Payer: PRIVATE HEALTH INSURANCE

## 2023-05-26 NOTE — Progress Notes (Deleted)
Patient presents today for a BP check, patient currently taking amLODipine 10mg  and hydrochlorothiazide 12.5mg .

## 2023-08-21 ENCOUNTER — Encounter: Payer: PRIVATE HEALTH INSURANCE | Admitting: Nurse Practitioner

## 2023-09-09 ENCOUNTER — Other Ambulatory Visit: Payer: Self-pay | Admitting: Nurse Practitioner

## 2023-11-11 ENCOUNTER — Other Ambulatory Visit: Payer: Self-pay | Admitting: Nurse Practitioner

## 2023-11-11 DIAGNOSIS — I10 Essential (primary) hypertension: Secondary | ICD-10-CM

## 2023-12-20 ENCOUNTER — Encounter: Payer: Self-pay | Admitting: Nurse Practitioner

## 2023-12-20 ENCOUNTER — Ambulatory Visit: Payer: PRIVATE HEALTH INSURANCE | Admitting: Nurse Practitioner

## 2023-12-20 VITALS — BP 140/90 | HR 78 | Temp 98.4°F | Ht 65.0 in | Wt 229.6 lb

## 2023-12-20 DIAGNOSIS — D508 Other iron deficiency anemias: Secondary | ICD-10-CM

## 2023-12-20 DIAGNOSIS — R7303 Prediabetes: Secondary | ICD-10-CM | POA: Diagnosis not present

## 2023-12-20 DIAGNOSIS — Z6838 Body mass index (BMI) 38.0-38.9, adult: Secondary | ICD-10-CM

## 2023-12-20 DIAGNOSIS — E66812 Obesity, class 2: Secondary | ICD-10-CM | POA: Diagnosis not present

## 2023-12-20 DIAGNOSIS — I10 Essential (primary) hypertension: Secondary | ICD-10-CM | POA: Diagnosis not present

## 2023-12-20 DIAGNOSIS — Z2821 Immunization not carried out because of patient refusal: Secondary | ICD-10-CM

## 2023-12-20 DIAGNOSIS — E6609 Other obesity due to excess calories: Secondary | ICD-10-CM

## 2023-12-20 MED ORDER — AMLODIPINE BESYLATE 10 MG PO TABS: 10.0000 mg | ORAL_TABLET | Freq: Every day | ORAL | 1 refills | Status: AC

## 2023-12-20 MED ORDER — HYDROCHLOROTHIAZIDE 12.5 MG PO TABS: 12.5000 mg | ORAL_TABLET | Freq: Every day | ORAL | 1 refills | Status: AC

## 2023-12-20 NOTE — Patient Instructions (Signed)
 Goal to exercise 150 minutes per week with at least 2 days of strength training Encouraged to park further when at the store, take stairs instead of elevators and to walk in place during commercials. Increase water intake to at least one gallon of water daily.  Avoid white, processed foods Limit sodas to once a week, these are loaded with sugar and salt.

## 2023-12-20 NOTE — Assessment & Plan Note (Signed)
 Blood pressure is elevated, repeat blood pressure is better but elevated.  Patient non compliant with blood pressure follow up, 3 months past due for BP follow up.  Referral to HTN clinic for BP management and compliance

## 2023-12-20 NOTE — Progress Notes (Signed)
 [place], CMA,acting as a Neurosurgeon for Susanna Epley, FNP.,have documented all relevant documentation on the behalf of Susanna Epley, FNP,as directed by  Susanna Epley, FNP while in the presence of Susanna Epley, FNP.  Subjective:  Patient ID: Lindsay Hess , female    DOB: 1996-06-27 , 28 y.o.   MRN: [id number]  Chief Complaint  Patient presents with   Hypertension    HPI  Patient presents today for a bp and pre dm follow up, Patient reports compliance with medication. Patient denies any chest pain, SOB, or headaches. Patient has no concerns today.    She has not taken her BP medications in the past 2-3 weeks.  She she is spot checking her blood pressures.  She also has been working overtime a lot the past month and feel that this attributes to mild swelling in her feet and legs.  She is not spot checking her blood pressures.     She endorses an unhealthy diet, not reducing salt.  Walks all day at work, but plans to start back to the gym next month.  States that she needs to drink more water, drinks diet green tea more than water.     Hypertension This is a chronic problem. The current episode started more than 1 year ago. The problem has been gradually worsening since onset. The problem is uncontrolled. Associated symptoms include headaches and malaise/fatigue. Pertinent negatives include no chest pain, palpitations or shortness of breath. There are no associated agents to hypertension. Risk factors for coronary artery disease include obesity, family history, sedentary lifestyle and stress. Past treatments include calcium channel blockers and diuretics. The current treatment provides mild improvement. Compliance problems include diet and exercise.      Past Medical History:  Diagnosis Date   Hypertension      Family History  Problem Relation Age of Onset   Hypertension Mother    Diabetes Father    Hypertension Maternal Grandmother    Diabetes Other    Hypertension Other       Current Outpatient Medications:    metFORMIN  (GLUCOPHAGE ) 500 MG tablet, Take 1 tablet (500 mg total) by mouth daily with breakfast., Disp: 90 tablet, Rfl: 1   Vitamin D , Ergocalciferol , (DRISDOL ) 1.25 MG (50000 UNIT) CAPS capsule, Take 1 capsule (50,000 Units total) by mouth every 7 (seven) days., Disp: 24 capsule, Rfl: 1   amLODipine  (NORVASC ) 10 MG tablet, Take 1 tablet (10 mg total) by mouth daily., Disp: 90 tablet, Rfl: 1   Ferric Maltol  (ACCRUFER ) 30 MG CAPS, Take 1 capsule (30 mg total) by mouth daily., Disp: 30 capsule, Rfl: 3   hydrochlorothiazide  (HYDRODIURIL ) 12.5 MG tablet, Take 1 tablet (12.5 mg total) by mouth daily., Disp: 90 tablet, Rfl: 1   No Known Allergies   Review of Systems  Constitutional:  Positive for malaise/fatigue. Negative for chills, fatigue and fever.  HENT:  Negative for congestion.   Eyes:  Negative for visual disturbance.  Respiratory:  Negative for cough, chest tightness and shortness of breath.   Cardiovascular:  Negative for chest pain and palpitations.  Neurological:  Positive for headaches.  All other systems reviewed and are negative.    Today's Vitals   12/20/23 1540 12/20/23 1620  BP: (!) 160/90 (!) 140/90  Pulse: 78   Temp: 98.4 F (36.9 C)   TempSrc: Oral   Weight: 229 lb 9.6 oz (104.1 kg)   Height: 5\' 5"  (1.651 m)   PainSc: 0-No pain    Body mass index  is 38.21 kg/m.  Wt Readings from Last 3 Encounters:  12/20/23 229 lb 9.6 oz (104.1 kg)  05/12/23 222 lb (100.7 kg)  04/20/23 222 lb 12.8 oz (101.1 kg)     Objective:  Physical Exam Constitutional:      Appearance: Normal appearance.  Cardiovascular:     Rate and Rhythm: Normal rate and regular rhythm.     Pulses: Normal pulses.     Heart sounds: Normal heart sounds.  Pulmonary:     Effort: Pulmonary effort is normal.     Breath sounds: Normal breath sounds.  Skin:    General: Skin is warm and dry.     Capillary Refill: Capillary refill takes less than 2 seconds.   Neurological:     General: No focal deficit present.     Mental Status: She is alert and oriented to person, place, and time. Mental status is at baseline.  Psychiatric:        Mood and Affect: Mood normal.        Behavior: Behavior normal.        Thought Content: Thought content normal.        Judgment: Judgment normal.         Assessment And Plan:  Prediabetes Assessment & Plan: Will check HgbA1c. Continue focusing on healthy diet  Orders: -     Hemoglobin A1c  Uncontrolled hypertension Assessment & Plan: Blood pressure remains elevated she has not been taking her blood pressure medications continuously I have discussed again with her adherence to medications.  Will recheck the eGFR.  Orders: -     BMP8+eGFR -     amLODIPine  Besylate; Take 1 tablet (10 mg total) by mouth daily.  Dispense: 90 tablet; Refill: 1 -     hydroCHLOROthiazide ; Take 1 tablet (12.5 mg total) by mouth daily.  Dispense: 90 tablet; Refill: 1 -     Ambulatory referral to [facility] -     CBC with Differential/Platelet  Iron deficiency anemia secondary to inadequate dietary iron intake Assessment & Plan: She has not been taking her Accufe therapy r supplement we will recheck her iron levels.  Orders: -     ACCRUFeR ; Take 1 capsule (30 mg total) by mouth daily.  Dispense: 30 capsule; Refill: 3  COVID-19 vaccination declined Assessment & Plan: Declines covid 19 vaccine. Discussed risk of covid 62 and if she changes her mind about the vaccine to call the office. Education has been provided regarding the importance of this vaccine but patient still declined. Advised may receive this vaccine at local pharmacy or Health Dept.or vaccine clinic. Aware to provide a copy of the vaccination record if obtained from local pharmacy or Health Dept.  Encouraged to take multivitamin, vitamin d , vitamin c and zinc to increase immune system. Aware can call office if would like to have vaccine here at  office. Verbalized acceptance and understanding.    Class 2 obesity due to excess calories with body mass index (BMI) of 38.0 to 38.9 in adult, unspecified whether serious comorbidity present Assessment & Plan: She is encouraged to strive for BMI less than 30 to decrease cardiac risk. Advised to aim for at least 150 minutes of exercise per week.    Other orders -     Iron and TIBC -     Ferritin -     Specimen status report    Return in about 4 months (around 04/20/2024) for phy when able.  Patient was given opportunity to ask  questions. Patient verbalized understanding of the plan and was able to repeat key elements of the plan. All questions were answered to their satisfaction.   I have reviewed this encounter including the documentation in this note and/or discussed this patient with Mickael Alamo FNP Student. I am certifying that I agree with the content of this note as the primary care nurse practitioner.  Susanna Epley, DNP, FNP-BC  I, Susanna Epley, FNP, have reviewed all documentation for this visit. The documentation on 12/20/23 for the exam, diagnosis, procedures, and orders are all accurate and complete.   IF YOU HAVE BEEN REFERRED TO A SPECIALIST, IT MAY TAKE 1-2 WEEKS TO SCHEDULE/PROCESS THE REFERRAL. IF YOU HAVE NOT HEARD FROM US /SPECIALIST IN TWO WEEKS, PLEASE GIVE US  A CALL AT [phone].

## 2023-12-21 ENCOUNTER — Ambulatory Visit: Payer: Self-pay

## 2023-12-21 LAB — CBC WITH DIFFERENTIAL/PLATELET
Basophils Absolute: 0.1 10*3/uL (ref 0.0–0.2)
Basos: 1 %
EOS (ABSOLUTE): 0.1 10*3/uL (ref 0.0–0.4)
Eos: 1 %
Hematocrit: 26.2 % — ABNORMAL LOW (ref 34.0–46.6)
Hemoglobin: 7.4 g/dL — ABNORMAL LOW (ref 11.1–15.9)
Immature Grans (Abs): 0 10*3/uL (ref 0.0–0.1)
Immature Granulocytes: 0 %
Lymphocytes Absolute: 2.3 10*3/uL (ref 0.7–3.1)
Lymphs: 39 %
MCH: 17.3 pg — ABNORMAL LOW (ref 26.6–33.0)
MCHC: 28.2 g/dL — ABNORMAL LOW (ref 31.5–35.7)
MCV: 61 fL — ABNORMAL LOW (ref 79–97)
Monocytes Absolute: 0.6 10*3/uL (ref 0.1–0.9)
Monocytes: 10 %
Neutrophils Absolute: 3 10*3/uL (ref 1.4–7.0)
Neutrophils: 49 %
Platelets: 356 10*3/uL (ref 150–450)
RBC: 4.28 x10E6/uL (ref 3.77–5.28)
RDW: 17.6 % — ABNORMAL HIGH (ref 11.7–15.4)
WBC: 6.1 10*3/uL (ref 3.4–10.8)

## 2023-12-21 LAB — BMP8+EGFR
BUN/Creatinine Ratio: 18 (ref 9–23)
BUN: 12 mg/dL (ref 6–20)
CO2: 24 mmol/L (ref 20–29)
Calcium: 9.2 mg/dL (ref 8.7–10.2)
Chloride: 103 mmol/L (ref 96–106)
Creatinine, Ser: 0.66 mg/dL (ref 0.57–1.00)
Glucose: 90 mg/dL (ref 70–99)
Potassium: 3.7 mmol/L (ref 3.5–5.2)
Sodium: 139 mmol/L (ref 134–144)
eGFR: 122 mL/min/{1.73_m2} (ref >59)

## 2023-12-21 LAB — HEMOGLOBIN A1C
Est. average glucose Bld gHb Est-mCnc: 120 mg/dL
Hgb A1c MFr Bld: 5.8 % — ABNORMAL HIGH (ref 4.8–5.6)

## 2023-12-21 NOTE — Telephone Encounter (Signed)
 Copied from CRM [id number]. Topic: Clinical - Lab/Test Results >> Dec 21, 2023 12:11 PM [employer] wrote: Reason for CRM: Patient has questions about their labs   Chief Complaint: Lab Results  Disposition: [] ED /[] Urgent Care (no appt availability in office) / [] Appointment(In office/virtual)/ []  [place] [facility]/ [] Home Care/ [] Refused Recommended Disposition /[] [place] [facility]/ [x]  Follow-up with PCP Additional Notes: Patient called in regarding lab results, no questions at this time, no information needed.

## 2023-12-23 ENCOUNTER — Other Ambulatory Visit: Payer: Self-pay | Admitting: Nurse Practitioner

## 2023-12-23 DIAGNOSIS — D508 Other iron deficiency anemias: Secondary | ICD-10-CM

## 2023-12-23 LAB — IRON AND TIBC
Iron Saturation: 3 % — CL (ref 15–55)
Iron: 14 ug/dL — ABNORMAL LOW (ref 27–159)
Total Iron Binding Capacity: 437 ug/dL (ref 250–450)
UIBC: 423 ug/dL (ref 131–425)

## 2023-12-23 LAB — FERRITIN: Ferritin: 6 ng/mL — ABNORMAL LOW (ref 15–150)

## 2023-12-23 LAB — SPECIMEN STATUS REPORT

## 2024-01-01 ENCOUNTER — Encounter: Payer: Self-pay | Admitting: Nurse Practitioner

## 2024-01-01 DIAGNOSIS — E6609 Other obesity due to excess calories: Secondary | ICD-10-CM | POA: Insufficient documentation

## 2024-01-01 DIAGNOSIS — Z2821 Immunization not carried out because of patient refusal: Secondary | ICD-10-CM | POA: Insufficient documentation

## 2024-01-01 DIAGNOSIS — I10 Essential (primary) hypertension: Secondary | ICD-10-CM | POA: Insufficient documentation

## 2024-01-01 MED ORDER — ACCRUFER 30 MG PO CAPS: 30.0000 mg | ORAL_CAPSULE | Freq: Every day | ORAL | 3 refills | Status: AC

## 2024-01-01 NOTE — Assessment & Plan Note (Signed)

## 2024-01-01 NOTE — Assessment & Plan Note (Signed)
Will check HgbA1c. Continue focusing on healthy diet

## 2024-01-01 NOTE — Assessment & Plan Note (Signed)
 She has not been taking her Accufe therapy r supplement we will recheck her iron levels.

## 2024-01-01 NOTE — Assessment & Plan Note (Signed)
 She is encouraged to strive for BMI less than 30 to decrease cardiac risk. Advised to aim for at least 150 minutes of exercise per week.

## 2024-01-01 NOTE — Assessment & Plan Note (Signed)
 Blood pressure remains elevated she has not been taking her blood pressure medications continuously I have discussed again with her adherence to medications.  Will recheck the eGFR.

## 2024-01-26 ENCOUNTER — Inpatient Hospital Stay: Payer: PRIVATE HEALTH INSURANCE | Attending: Hematology and Oncology | Admitting: Hematology and Oncology

## 2024-01-26 ENCOUNTER — Inpatient Hospital Stay: Payer: PRIVATE HEALTH INSURANCE

## 2024-02-09 ENCOUNTER — Encounter (HOSPITAL_BASED_OUTPATIENT_CLINIC_OR_DEPARTMENT_OTHER): Payer: Self-pay

## 2024-02-14 ENCOUNTER — Encounter (HOSPITAL_BASED_OUTPATIENT_CLINIC_OR_DEPARTMENT_OTHER): Payer: Self-pay | Admitting: Cardiovascular Disease

## 2024-02-14 ENCOUNTER — Ambulatory Visit (HOSPITAL_BASED_OUTPATIENT_CLINIC_OR_DEPARTMENT_OTHER): Payer: PRIVATE HEALTH INSURANCE | Admitting: Cardiovascular Disease

## 2024-02-14 VITALS — BP 124/86 | HR 94 | Ht 66.0 in | Wt 226.7 lb

## 2024-02-14 DIAGNOSIS — R0681 Apnea, not elsewhere classified: Secondary | ICD-10-CM

## 2024-02-14 DIAGNOSIS — I10 Essential (primary) hypertension: Secondary | ICD-10-CM

## 2024-02-14 DIAGNOSIS — R0683 Snoring: Secondary | ICD-10-CM

## 2024-02-14 NOTE — Patient Instructions (Addendum)
 Medication Instructions:  Your physician recommends that you continue on your current medications as directed. Please refer to the Current Medication list given to you today.   Labwork: RENIN/ALDOSTERONE/TSH SOON   Testing/Procedures: [facility] HOME SLEEP STUDY   Your physician has requested that you have a renal artery duplex. During this test, an ultrasound is used to evaluate blood flow to the kidneys. Allow one hour for this exam. Do not eat after midnight the day before and avoid carbonated beverages. Take your medications as you usually do.  Follow-Up: 3 MONTHS WITH CAITLIN W NP, MICHELLE S NP, OR DR [place]   Any Other Special Instructions Will Be Listed Below (If Applicable).  MyFitnessPal app  WatchPAT?  Is a FDA cleared portable home sleep study test that uses a watch and 3 points of contact to monitor 7 different channels, including your heart rate, oxygen saturations, body position, snoring, and chest motion.  The study is easy to use from the comfort of your own home and accurately detect sleep apnea.  Before bed, you attach the chest sensor, attached the sleep apnea bracelet to your nondominant hand, and attach the finger probe.  After the study, the raw data is downloaded from the watch and scored for apnea events.   For more information: [email or web address]-[email or web address]  Patient Testing Instructions:  Do not put battery into the device until bedtime when you are ready to begin the test. Please call the support number if you need assistance after following the instructions below: 24 hour support line- [phone] or [facility] support at [phone] (option 2)  Download the [facility] WatchPAT One app through the google play store or App Store  Be sure to turn on or enable access to bluetooth in settlings on your smartphone/ device  Make sure no other bluetooth devices are on and within the vicinity of your smartphone/ device and WatchPAT watch during testing.  Make sure  to leave your smart phone/ device plugged in and charging all night.  When ready for bed:  Follow the instructions step by step in the WatchPAT One App to activate the testing device. For additional instructions, including video instruction, visit the WatchPAT One video on Youtube. You can search for WatchPat One within Youtube (video is 4 minutes and 18 seconds) or enter: https://youtube/watch?[email or web address]_vbiwxE Please note: You will be prompted to enter a Pin to connect via bluetooth when starting the test. The PIN will be assigned to you when you receive the test.  The device is disposable, but it recommended that you retain the device until you receive a call letting you know the study has been received and the results have been interpreted.  We will let you know if the study did not transmit to us  properly after the test is completed. You do not need to call us  to confirm the receipt of the test.  Please complete the test within 48 hours of receiving PIN.   Frequently Asked Questions:  What is Watch Deatra Face one?  A single use fully disposable home sleep apnea testing device and will not need to be returned after completion.  What are the requirements to use WatchPAT one?  The be able to have a successful watchpat one sleep study, you should have your Watch pat one device, your smart phone, watch pat one app, your PIN number and Internet access What type of phone do I need?  You should have a smart phone that uses Android 5.1 and above or any Iphone with IOS 10 and above  How can I download the WatchPAT one app?  Based on your device type search for WatchPAT one app either in google play for android devices or APP store for Iphone's Where will I get my PIN for the study?  Your PIN will be provided by your physician's office. It is used for authentication and if you lose/forget your PIN, please reach out to your providers office.  I do not have Internet at home. Can I do WatchPAT one study?  WatchPAT  One needs Internet connection throughout the night to be able to transmit the sleep data. You can use your home/local internet or your cellular's data package. However, it is always recommended to use home/local Internet. It is estimated that between 20MB-30MB will be used with each study.However, the application will be looking for space in the phone to start the study.  What happens if I lose internet or bluetooth connection?  During the internet disconnection, your phone will not be able to transmit the sleep data. All the data, will be stored in your phone. As soon as the internet connection is back on, the phone will being sending the sleep data. During the bluetooth disconnection, WatchPAT one will not be able to to send the sleep data to your phone. Data will be kept in the WatchPAT one until two devices have bluetooth connection back on. As soon as the connection is back on, WatchPAT one will send the sleep data to the phone.  How long do I need to wear the WatchPAT one?  After you start the study, you should wear the device at least 6 hours.  How far should I keep my phone from the device?  During the night, your phone should be within 15 feet.  What happens if I leave the room for restroom or other reasons?  Leaving the room for any reason will not cause any problem. As soon as your get back to the room, both devices will reconnect and will continue to send the sleep data. Can I use my phone during the sleep study?  Yes, you can use your phone as usual during the study. But it is recommended to put your watchpat one on when you are ready to go to bed.  How will I get my study results?  A soon as you completed your study, your sleep data will be sent to the provider. They will then share the results with you when they are ready.

## 2024-02-14 NOTE — Progress Notes (Signed)
 Advanced [facility]pertension Clinic Initial Assessment:    Date:  02/14/2024   ID:  Dalexa Gentz, DOB [birth year], MRN [id number]  PCP:  Susanna Epley, FNP  Cardiologist:  None   Referring MD: Susanna Epley, FNP   CC: Hypertension  History of Present Illness:    Baljit Liebert is a 28 y.o. female with a hx of hypertension and prediabetes here to establish care in the [facility] Hypertension Clinic.  She last saw her PCP, Susanna Epley, FNP, 11/2023.  At the time she had not been on her blood pressure medications for the preceding 2 to 3 weeks.  She was working a lot and noted some lower extremity edema.  Blood pressure in the office was initially 160/90 and improved to 140/90 after rest.  She was prescribed amlodipine  and hydrochlorothiazide  and referred to the advanced hypertension clinic.  Discussed the use of AI scribe software for clinical note transcription with the patient, who gave verbal consent to proceed.  History of Present Illness Ms. Caretha Chapel was diagnosed with hypertension a couple of years ago and has been managing it with medication, increased water intake, and physical activity, primarily through walking and occasional gym visits. Stress management has also been a focus for her.  No chest pain or shortness of breath during physical activity. 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 No orthopnea. She has not been checking her blood pressure at home recently.  Family history is significant for hypertension; her mother also has high blood pressure and a history of heart attack. She is unsure of the age at which her mother was diagnosed with hypertension.  In terms of diet, she frequently eats out and tries to watch her salt intake. She consumes caffeine about once a week and drinks alcohol only on special occasions. She does not use any supplements or herbs regularly. For pain, she takes Tylenol  and avoids ibuprofen and Aleve.  Regarding sleep, she sometimes snores when very tired and occasionally feels tired upon waking. Her mother once mentioned that it seemed like she stopped breathing during sleep, but this has not been formally evaluated.  Previous antihypertensives:   Past Medical History:  Diagnosis Date   Hypertension     History reviewed. No pertinent surgical history.  Current Medications: Current Meds  Medication Sig   amLODipine  (NORVASC ) 10 MG tablet Take 1 tablet (10 mg total) by mouth daily.   hydrochlorothiazide  (HYDRODIURIL ) 12.5 MG tablet Take 1 tablet (12.5 mg total) by mouth daily.   metFORMIN  (GLUCOPHAGE ) 500 MG tablet Take 1 tablet (500 mg total) by mouth daily with breakfast.     Allergies:   Patient has no known allergies.   Social History   Socioeconomic History   Marital status: Single    Spouse name: Not on file   Number of children: Not on file   Years of education: Not on file   Highest education level: Not on file  Occupational History   Not on file  Tobacco Use   Smoking status: Never   Smokeless tobacco: Never  Vaping Use   Vaping status: Never Used  Substance and Sexual Activity   Alcohol use: Yes    Comment: occ   Drug use: No   Sexual activity: Yes    Birth control/protection: None  Other Topics Concern   Not on file  Social History Narrative   Not on file   Social Drivers of Health  Financial Resource Strain: Low Risk  (02/14/2024)   Overall Financial Resource Strain (CARDIA)    Difficulty of Paying Living Expenses: Not hard at all  Food Insecurity: No Food Insecurity (02/14/2024)   Hunger Vital Sign    Worried About Running Out of Food in the Last Year: Never true    Ran Out of Food in the Last Year: Never true  Transportation Needs: No Transportation Needs (02/14/2024)   PRAPARE - Administrator, Civil Service (Medical): No    Lack of Transportation (Non-Medical): No  Physical Activity: Sufficiently  Active (02/14/2024)   Exercise Vital Sign    Days of Exercise per Week: 4 days    Minutes of Exercise per Session: 60 min  Stress: No Stress Concern Present (02/14/2024)   [employer] of Occupational Health - Occupational Stress Questionnaire    Feeling of Stress: Not at all  Social Connections: Not on file     Family History: The patient's family history includes Diabetes in her father and another family member; Heart attack in her mother; Hypertension in her maternal grandmother, mother, and another family member.  ROS:   Please see the history of present illness.    All other systems reviewed and are negative.  EKGs/Labs/Other Studies Reviewed:    EKG:    EKG Interpretation Date/Time:  Wednesday February 14 2024 15:10:22 EDT Ventricular Rate:  78 PR Interval:  142 QRS Duration:  84 QT Interval:  400 QTC Calculation: 456 R Axis:   1  Text Interpretation: Normal sinus rhythm Minimal voltage criteria for LVH, may be normal variant ( R in aVL ) No previous ECGs available Confirmed by Maudine Sos ([id number]) on 02/14/2024 3:22:06 PM         Recent Labs: 12/20/2023: BUN 12; Creatinine, Ser 0.66; Hemoglobin 7.4; Platelets 356; Potassium 3.7; Sodium 139   Recent Lipid Panel    Component Value Date/Time   CHOL 155 02/15/2022 1552   TRIG 106 02/15/2022 1552   HDL 52 02/15/2022 1552   CHOLHDL 3.0 02/15/2022 1552   LDLCALC 84 02/15/2022 1552    Physical Exam:   VS:  BP 124/86 (BP Location: Right Arm, Patient Position: Sitting)   Pulse 94   Ht 5' 6 (1.676 m)   Wt 226 lb 11.2 oz (102.8 kg)   SpO2 100%   BMI 36.59 kg/m  , BMI Body mass index is 36.59 kg/m. GENERAL:  Well appearing HEENT: Pupils equal round and reactive, fundi not visualized, oral mucosa unremarkable.  +gingival hyperplasia NECK:  No jugular venous distention, waveform within normal limits, carotid upstroke brisk and symmetric, no bruits, no thyromegaly LUNGS:  Clear to auscultation  bilaterally HEART:  RRR.  PMI not displaced or sustained,S1 and S2 within normal limits, no S3, no S4, no clicks, no rubs, I/VI systolic murmur at LUSB ABD:  Flat, positive bowel sounds normal in frequency in pitch, no bruits, no rebound, no guarding, no midline pulsatile mass, no hepatomegaly, no splenomegaly EXT:  2 plus pulses throughout, no edema, no cyanosis no clubbing SKIN:  No rashes no nodules NEURO:  Cranial nerves II through XII grossly intact, motor grossly intact throughout PSYCH:  Cognitively intact, oriented to person place and time   ASSESSMENT/PLAN:    Assessment & Plan # Hypertension Hypertension well-controlled with medication and lifestyle modifications. Discussed importance of blood pressure management to prevent cardiovascular events. Evaluating secondary causes with lab work and imaging.  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  She reports that this pre-dated her  amlodipine  use and has not changed.   - Check TSH, aldosterone, and renin levels. - Schedule renal Doppler - Recommend home sleep study for sleep apnea. - Advise dietary sodium reduction and regular exercise. - Provide educational materials on blood pressure management. - Schedule follow-up in a few months.  # Obesity: Encouraged at least 150 minutes of exercise weekly.   Screening for Secondary Hypertension:     02/14/2024    3:27 PM  Causes  Drugs/Herbals Screened  Renovascular HTN Screened     - Comments check renal Dopplers  Sleep Apnea Screened     - Comments +snoring, apnea  Thyroid Disease Screened     - Comments check TSH  Hyperaldosteronism Screened     - Comments check renin/aldo  Pheochromocytoma N/A  Cushing's Syndrome N/A  Hyperparathyroidism Screened  Coarctation of the Aorta Screened     - Comments BP symmetric  Compliance Screened    Relevant Labs/Studies:    Latest Ref Rng & Units 12/20/2023    4:36 PM 04/20/2023    3:32 PM 02/15/2022    3:52 PM  Basic Labs  Sodium 134 - 144  mmol/L 139  139  141   Potassium 3.5 - 5.2 mmol/L 3.7  4.1  3.8   Creatinine 0.57 - 1.00 mg/dL 4.09  8.11  9.14                    02/14/2024    3:40 PM  Renovascular   Renal Artery US  Completed Yes     Disposition:    FU with MD/PharmD in 3 months    Medication Adjustments/Labs and Tests Ordered: Current medicines are reviewed at length with the patient today.  Concerns regarding medicines are outlined above.  Orders Placed This Encounter  Procedures   TSH   Aldosterone + renin activity w/ ratio   EKG 12-Lead   [facility] Sleep Study   VAS US  RENAL ARTERY DUPLEX   No orders of the defined types were placed in this encounter.    Signed, Maudine Sos, MD  02/14/2024 3:57 PM    [facility]

## 2024-03-26 ENCOUNTER — Ambulatory Visit (HOSPITAL_BASED_OUTPATIENT_CLINIC_OR_DEPARTMENT_OTHER): Payer: Self-pay

## 2024-03-26 DIAGNOSIS — I10 Essential (primary) hypertension: Secondary | ICD-10-CM | POA: Diagnosis not present

## 2024-04-03 ENCOUNTER — Ambulatory Visit: Payer: Self-pay | Admitting: Cardiovascular Disease

## 2024-05-01 ENCOUNTER — Encounter: Payer: PRIVATE HEALTH INSURANCE | Admitting: Nurse Practitioner

## 2024-05-01 NOTE — Progress Notes (Deleted)
 LILLETTE Kristeen JINNY Gladis, CMA,acting as a Neurosurgeon for Gaines Ada, FNP.,have documented all relevant documentation on the behalf of Gaines Ada, FNP,as directed by  Gaines Ada, FNP while in the presence of Gaines Ada, FNP.  Subjective:    Patient ID: Lindsay Hess , female    DOB: 01-26-1996 , 28 y.o.   MRN: [id number]  No chief complaint on file.   HPI  HPI   Past Medical History:  Diagnosis Date   Hypertension      Family History  Problem Relation Age of Onset   Heart attack Mother    Hypertension Mother    Diabetes Father    Hypertension Maternal Grandmother    Diabetes Other    Hypertension Other      Current Outpatient Medications:    amLODipine  (NORVASC ) 10 MG tablet, Take 1 tablet (10 mg total) by mouth daily., Disp: 90 tablet, Rfl: 1   Ferric Maltol  (ACCRUFER ) 30 MG CAPS, Take 1 capsule (30 mg total) by mouth daily. (Patient not taking: Reported on 02/14/2024), Disp: 30 capsule, Rfl: 3   hydrochlorothiazide  (HYDRODIURIL ) 12.5 MG tablet, Take 1 tablet (12.5 mg total) by mouth daily., Disp: 90 tablet, Rfl: 1   metFORMIN  (GLUCOPHAGE ) 500 MG tablet, Take 1 tablet (500 mg total) by mouth daily with breakfast., Disp: 90 tablet, Rfl: 1   Vitamin D , Ergocalciferol , (DRISDOL ) 1.25 MG (50000 UNIT) CAPS capsule, Take 1 capsule (50,000 Units total) by mouth every 7 (seven) days. (Patient not taking: Reported on 02/14/2024), Disp: 24 capsule, Rfl: 1   No Known Allergies    The patient states she uses {contraceptive methods:5051} for birth control. No LMP recorded.. {Dysmenorrhea-menorrhagia:21918}. Negative for: breast discharge, breast lump(s), breast pain and breast self exam. Associated symptoms include abnormal vaginal bleeding. Pertinent negatives include abnormal bleeding (hematology), anxiety, decreased libido, depression, difficulty falling sleep, dyspareunia, history of infertility, nocturia, sexual dysfunction, sleep disturbances, urinary incontinence, urinary urgency, vaginal  discharge and vaginal itching. Diet regular.The patient states her exercise level is    . The patient's tobacco use is:  Social History   Tobacco Use  Smoking Status Never  Smokeless Tobacco Never  . She has been exposed to passive smoke. The patient's alcohol use is:  Social History   Substance and Sexual Activity  Alcohol Use Yes   Comment: occ  . Additional information: Last pap ***, next one scheduled for ***.    Review of Systems   There were no vitals filed for this visit. There is no height or weight on file to calculate BMI.  Wt Readings from Last 3 Encounters:  02/14/24 226 lb 11.2 oz (102.8 kg)  12/20/23 229 lb 9.6 oz (104.1 kg)  05/12/23 222 lb (100.7 kg)     Objective:  Physical Exam      Assessment And Plan:     Encounter for annual health examination  Prediabetes  Uncontrolled hypertension  Iron deficiency anemia secondary to inadequate dietary iron intake     No follow-ups on file. Patient was given opportunity to ask questions. Patient verbalized understanding of the plan and was able to repeat key elements of the plan. All questions were answered to their satisfaction.   Gaines Ada, FNP  I, Gaines Ada, FNP, have reviewed all documentation for this visit. The documentation on 05/01/24 for the exam, diagnosis, procedures, and orders are all accurate and complete.

## 2024-05-16 ENCOUNTER — Encounter (HOSPITAL_BASED_OUTPATIENT_CLINIC_OR_DEPARTMENT_OTHER): Payer: Self-pay | Admitting: Family

## 2024-05-16 NOTE — Progress Notes (Deleted)
 [facility] Assessment:    Date:  05/16/2024   ID:  Lindsay Hess, DOB 03/25/1996, MRN [id number]  PCP:  Georgina Speaks, FNP  Cardiologist:  None  Nephrologist:  Referring MD: Georgina Speaks, FNP   CC: Hypertension  History of Present Illness:    Lindsay Hess is a 28 y.o. female with a hx of hypertension, prediabetes here to follow up in the [facility].   Established with [facility] 02/14/2024 after BP poorly controlled on amlodipine , HCTZ.  She been diagnosed with hypertension a couple years prior.  Family history notable for other with hypertension, heart attack.  No OTC agents noting to be contributing to high blood pressure.  She frequently eats out but does try to consume low-sodium diet.  She had caffeine once a week and alcohol only on special occasions.  She did have symptoms suggestive of sleep apnea and home sleep study ordered.  TSH, aldosterone, renin ordered but not collected. Renal artery duplex 02/2024 with no renal artery stenosis.  Presents today for follow up. ***   ?itamar prior auth?   Previous antihypertensives: Past Medical History:  Diagnosis Date   Hypertension     No past surgical history on file.  Current Medications: No outpatient medications have been marked as taking for the 05/16/24 encounter (Appointment) with Vannie Reche RAMAN, NP.     Allergies:   Patient has no known allergies.   Social History   Socioeconomic History   Marital status: Single    Spouse name: Not on file   Number of children: Not on file   Years of education: Not on file   Highest education level: Not on file  Occupational History   Not on file  Tobacco Use   Smoking status: Never   Smokeless tobacco: Never  Vaping Use   Vaping status: Never Used  Substance and Sexual Activity   Alcohol use: Yes    Comment: occ   Drug use: No   Sexual activity: Yes    Birth control/protection: None  Other Topics Concern    Not on file  Social History Narrative   Not on file   Social Drivers of Health   Financial Resource Strain: Low Risk  (02/14/2024)   Overall Financial Resource Strain (CARDIA)    Difficulty of Paying Living Expenses: Not hard at all  Food Insecurity: No Food Insecurity (02/14/2024)   Hunger Vital Sign    Worried About Running Out of Food in the Last Year: Never true    Ran Out of Food in the Last Year: Never true  Transportation Needs: No Transportation Needs (02/14/2024)   PRAPARE - Administrator, Civil Service (Medical): No    Lack of Transportation (Non-Medical): No  Physical Activity: Sufficiently Active (02/14/2024)   Exercise Vital Sign    Days of Exercise per Week: 4 days    Minutes of Exercise per Session: 60 min  Stress: No Stress Concern Present (02/14/2024)   [employer] of Occupational Health - Occupational Stress Questionnaire    Feeling of Stress: Not at all  Social Connections: Not on file     Family History: The patient's ***family history includes Diabetes in her father and another family member; Heart attack in her mother; Hypertension in her maternal grandmother, mother, and another family member.  ROS:   Please see the history of present illness.    *** All other systems reviewed and are negative.  EKGs/Labs/Other Studies Reviewed:  Recent Labs: 12/20/2023: BUN 12; Creatinine, Ser 0.66; Hemoglobin 7.4; Platelets 356; Potassium 3.7; Sodium 139   Recent Lipid Panel    Component Value Date/Time   CHOL 155 02/15/2022 1552   TRIG 106 02/15/2022 1552   HDL 52 02/15/2022 1552   CHOLHDL 3.0 02/15/2022 1552   LDLCALC 84 02/15/2022 1552    Physical Exam:   VS:  There were no vitals taken for this visit. , BMI There is no height or weight on file to calculate BMI. GENERAL:  Well appearing HEENT: Pupils equal round and reactive, fundi not visualized, oral mucosa unremarkable NECK:  No jugular venous distention, waveform within normal  limits, carotid upstroke brisk and symmetric, no bruits, no thyromegaly LYMPHATICS:  No cervical adenopathy LUNGS:  Clear to auscultation bilaterally HEART:  RRR.  PMI not displaced or sustained,S1 and S2 within normal limits, no S3, no S4, no clicks, no rubs, *** murmurs ABD:  Flat, positive bowel sounds normal in frequency in pitch, no bruits, no rebound, no guarding, no midline pulsatile mass, no hepatomegaly, no splenomegaly EXT:  2 plus pulses throughout, no edema, no cyanosis no clubbing SKIN:  No rashes no nodules NEURO:  Cranial nerves II through XII grossly intact, motor grossly intact throughout [facility]:  Cognitively intact, oriented to person place and time   ASSESSMENT/PLAN:    HTN -    Screening for Secondary Hypertension: { Click here to document screening for secondary causes of HTN  :[id number]}    02/14/2024    3:27 PM  Causes  Drugs/Herbals Screened  Renovascular HTN Screened     - Comments check renal Dopplers  Sleep Apnea Screened     - Comments +snoring, apnea  Thyroid Disease Screened     - Comments check TSH  Hyperaldosteronism Screened     - Comments check renin/aldo  Pheochromocytoma N/A  Cushing's Syndrome N/A  Hyperparathyroidism Screened  Coarctation of the Aorta Screened     - Comments BP symmetric  Compliance Screened    Relevant Labs/Studies:    Latest Ref Rng & Units 12/20/2023    4:36 PM 04/20/2023    3:32 PM 02/15/2022    3:52 PM  Basic Labs  Sodium 134 - 144 mmol/L 139  139  141   Potassium 3.5 - 5.2 mmol/L 3.7  4.1  3.8   Creatinine 0.57 - 1.00 mg/dL 9.33  9.34  9.21                    03/26/2024    9:33 AM  Renovascular   Renal Artery US  Completed Yes     she *** interested in enrolling in the PREP exercise and nutrition program through the [facility].     Disposition:    FU with MD/APP/PharmD in {gen number 9-89:689602} {Days to years:10300}    Medication Adjustments/Labs and Tests Ordered: Current medicines are reviewed at  length with the patient today.  Concerns regarding medicines are outlined above.  No orders of the defined types were placed in this encounter.  No orders of the defined types were placed in this encounter.    Signed, Reche GORMAN Finder, NP  05/16/2024 12:48 PM    [facility]

## 2024-05-24 ENCOUNTER — Telehealth: Payer: Self-pay

## 2024-05-24 NOTE — Telephone Encounter (Signed)
 Ordering provider: Dr. Raford  Associated diagnoses:  Essential hypertension benign [I10] Snoring [R06.83] Apnea [R06.81  WatchPAT PA obtained on 05/24/2024 by Dena JAYSON Hesselbach, CMA. Authorization: No prior authorization is required per [employer]  Patient notified of PIN ([id number]) on 05/24/2024 via Notification Method: phone.  Phone note routed to covering staff for follow-up.

## 2024-06-18 ENCOUNTER — Other Ambulatory Visit: Payer: Self-pay | Admitting: Nurse Practitioner

## 2024-06-18 DIAGNOSIS — I10 Essential (primary) hypertension: Secondary | ICD-10-CM
